# Patient Record
Sex: Female | Born: 1950 | Race: White | Hispanic: No | Marital: Married | Smoking: Former smoker
Health system: Southern US, Community
[De-identification: ages and names within clinical notes are randomized; demographics above are authoritative.]

## PROBLEM LIST (undated history)

## (undated) DIAGNOSIS — R918 Other nonspecific abnormal finding of lung field: Secondary | ICD-10-CM

## (undated) DIAGNOSIS — I10 Essential (primary) hypertension: Secondary | ICD-10-CM

## (undated) DIAGNOSIS — Z862 Personal history of diseases of the blood and blood-forming organs and certain disorders involving the immune mechanism: Secondary | ICD-10-CM

## (undated) DIAGNOSIS — M549 Dorsalgia, unspecified: Secondary | ICD-10-CM

## (undated) DIAGNOSIS — J449 Chronic obstructive pulmonary disease, unspecified: Secondary | ICD-10-CM

## (undated) DIAGNOSIS — R51 Headache: Secondary | ICD-10-CM

## (undated) DIAGNOSIS — M1990 Unspecified osteoarthritis, unspecified site: Secondary | ICD-10-CM

## (undated) DIAGNOSIS — B1910 Unspecified viral hepatitis B without hepatic coma: Secondary | ICD-10-CM

## (undated) DIAGNOSIS — E785 Hyperlipidemia, unspecified: Secondary | ICD-10-CM

## (undated) DIAGNOSIS — G8929 Other chronic pain: Secondary | ICD-10-CM

## (undated) DIAGNOSIS — I5032 Chronic diastolic (congestive) heart failure: Secondary | ICD-10-CM

## (undated) DIAGNOSIS — Z8719 Personal history of other diseases of the digestive system: Secondary | ICD-10-CM

## (undated) DIAGNOSIS — Z9289 Personal history of other medical treatment: Secondary | ICD-10-CM

## (undated) DIAGNOSIS — E039 Hypothyroidism, unspecified: Secondary | ICD-10-CM

## (undated) DIAGNOSIS — I2510 Atherosclerotic heart disease of native coronary artery without angina pectoris: Secondary | ICD-10-CM

## (undated) DIAGNOSIS — K219 Gastro-esophageal reflux disease without esophagitis: Secondary | ICD-10-CM

## (undated) DIAGNOSIS — IMO0002 Reserved for concepts with insufficient information to code with codable children: Secondary | ICD-10-CM

## (undated) HISTORY — DX: Chronic diastolic (congestive) heart failure: I50.32

## (undated) HISTORY — DX: Atherosclerotic heart disease of native coronary artery without angina pectoris: I25.10

## (undated) HISTORY — DX: Other nonspecific abnormal finding of lung field: R91.8

## (undated) HISTORY — DX: Hypothyroidism, unspecified: E03.9

## (undated) HISTORY — PX: CORONARY ANGIOPLASTY WITH STENT PLACEMENT: SHX49

## (undated) HISTORY — PX: JOINT REPLACEMENT: SHX530

## (undated) HISTORY — DX: Reserved for concepts with insufficient information to code with codable children: IMO0002

## (undated) HISTORY — DX: Headache: R51

## (undated) HISTORY — DX: Morbid (severe) obesity due to excess calories: E66.01

---
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
## 1983-03-25 HISTORY — PX: TUBAL LIGATION: SHX77

## 1986-03-24 HISTORY — PX: VAGINAL HYSTERECTOMY: SUR661

## 1997-09-25 ENCOUNTER — Inpatient Hospital Stay (HOSPITAL_COMMUNITY): Admission: EM | Admit: 1997-09-25 | Discharge: 1997-09-26 | Payer: Self-pay | Admitting: Emergency Medicine

## 1998-04-02 ENCOUNTER — Observation Stay (HOSPITAL_COMMUNITY): Admission: EM | Admit: 1998-04-02 | Discharge: 1998-04-03 | Payer: Self-pay | Admitting: Cardiology

## 1998-06-25 ENCOUNTER — Inpatient Hospital Stay (HOSPITAL_COMMUNITY): Admission: EM | Admit: 1998-06-25 | Discharge: 1998-06-26 | Payer: Self-pay | Admitting: Emergency Medicine

## 1998-06-26 ENCOUNTER — Encounter: Payer: Self-pay | Admitting: Cardiology

## 1998-11-25 ENCOUNTER — Inpatient Hospital Stay (HOSPITAL_COMMUNITY): Admission: EM | Admit: 1998-11-25 | Discharge: 1998-11-26 | Payer: Self-pay | Admitting: Cardiology

## 1999-09-29 ENCOUNTER — Inpatient Hospital Stay (HOSPITAL_COMMUNITY): Admission: EM | Admit: 1999-09-29 | Discharge: 1999-09-30 | Payer: Self-pay | Admitting: Internal Medicine

## 2000-03-24 DIAGNOSIS — G8929 Other chronic pain: Secondary | ICD-10-CM

## 2000-03-24 HISTORY — DX: Other chronic pain: G89.29

## 2001-01-15 ENCOUNTER — Ambulatory Visit (HOSPITAL_COMMUNITY): Admission: RE | Admit: 2001-01-15 | Discharge: 2001-01-15 | Payer: Self-pay | Admitting: Cardiology

## 2001-03-19 ENCOUNTER — Ambulatory Visit (HOSPITAL_COMMUNITY): Admission: RE | Admit: 2001-03-19 | Discharge: 2001-03-19 | Payer: Self-pay | Admitting: General Surgery

## 2004-06-03 ENCOUNTER — Emergency Department (HOSPITAL_COMMUNITY): Admission: EM | Admit: 2004-06-03 | Discharge: 2004-06-03 | Payer: Self-pay | Admitting: Emergency Medicine

## 2005-07-31 ENCOUNTER — Encounter (HOSPITAL_COMMUNITY): Admission: RE | Admit: 2005-07-31 | Discharge: 2005-08-30 | Payer: Self-pay | Admitting: General Surgery

## 2005-08-14 ENCOUNTER — Ambulatory Visit: Payer: Self-pay | Admitting: Cardiology

## 2005-09-30 ENCOUNTER — Ambulatory Visit (HOSPITAL_COMMUNITY): Admission: RE | Admit: 2005-09-30 | Discharge: 2005-09-30 | Payer: Self-pay | Admitting: Cardiology

## 2007-10-15 ENCOUNTER — Ambulatory Visit (HOSPITAL_COMMUNITY): Admission: RE | Admit: 2007-10-15 | Discharge: 2007-10-15 | Payer: Self-pay | Admitting: Cardiology

## 2007-10-15 ENCOUNTER — Ambulatory Visit: Payer: Self-pay | Admitting: Cardiology

## 2007-10-17 ENCOUNTER — Emergency Department (HOSPITAL_COMMUNITY): Admission: EM | Admit: 2007-10-17 | Discharge: 2007-10-17 | Payer: Self-pay | Admitting: Emergency Medicine

## 2007-10-19 ENCOUNTER — Ambulatory Visit: Payer: Self-pay | Admitting: Cardiology

## 2007-10-19 ENCOUNTER — Inpatient Hospital Stay (HOSPITAL_COMMUNITY): Admission: AD | Admit: 2007-10-19 | Discharge: 2007-10-22 | Payer: Self-pay | Admitting: Cardiology

## 2007-10-19 ENCOUNTER — Inpatient Hospital Stay (HOSPITAL_BASED_OUTPATIENT_CLINIC_OR_DEPARTMENT_OTHER): Admission: RE | Admit: 2007-10-19 | Discharge: 2007-10-19 | Payer: Self-pay | Admitting: Cardiology

## 2007-10-20 ENCOUNTER — Encounter: Payer: Self-pay | Admitting: Cardiology

## 2007-11-04 ENCOUNTER — Ambulatory Visit: Payer: Self-pay | Admitting: Cardiology

## 2008-02-28 ENCOUNTER — Ambulatory Visit: Payer: Self-pay | Admitting: Cardiology

## 2008-03-24 HISTORY — PX: SHOULDER OPEN ROTATOR CUFF REPAIR: SHX2407

## 2008-04-13 ENCOUNTER — Ambulatory Visit: Payer: Self-pay | Admitting: Cardiology

## 2008-04-14 ENCOUNTER — Ambulatory Visit (HOSPITAL_COMMUNITY): Admission: RE | Admit: 2008-04-14 | Discharge: 2008-04-14 | Payer: Self-pay | Admitting: Cardiology

## 2008-10-10 DIAGNOSIS — R519 Headache, unspecified: Secondary | ICD-10-CM | POA: Insufficient documentation

## 2008-10-10 DIAGNOSIS — R51 Headache: Secondary | ICD-10-CM

## 2008-10-10 DIAGNOSIS — I2510 Atherosclerotic heart disease of native coronary artery without angina pectoris: Secondary | ICD-10-CM | POA: Insufficient documentation

## 2008-10-10 DIAGNOSIS — I10 Essential (primary) hypertension: Secondary | ICD-10-CM | POA: Insufficient documentation

## 2008-10-10 DIAGNOSIS — E039 Hypothyroidism, unspecified: Secondary | ICD-10-CM

## 2008-10-10 DIAGNOSIS — I679 Cerebrovascular disease, unspecified: Secondary | ICD-10-CM

## 2008-10-10 DIAGNOSIS — M549 Dorsalgia, unspecified: Secondary | ICD-10-CM | POA: Insufficient documentation

## 2008-10-10 DIAGNOSIS — D649 Anemia, unspecified: Secondary | ICD-10-CM

## 2008-10-10 DIAGNOSIS — M1990 Unspecified osteoarthritis, unspecified site: Secondary | ICD-10-CM | POA: Insufficient documentation

## 2008-10-22 ENCOUNTER — Inpatient Hospital Stay (HOSPITAL_COMMUNITY): Admission: EM | Admit: 2008-10-22 | Discharge: 2008-11-04 | Payer: Self-pay | Admitting: Cardiology

## 2008-10-22 ENCOUNTER — Ambulatory Visit: Payer: Self-pay | Admitting: Cardiovascular Disease

## 2008-10-23 ENCOUNTER — Encounter (INDEPENDENT_AMBULATORY_CARE_PROVIDER_SITE_OTHER): Payer: Self-pay | Admitting: Internal Medicine

## 2008-10-25 ENCOUNTER — Encounter: Payer: Self-pay | Admitting: Cardiology

## 2008-10-27 ENCOUNTER — Encounter: Payer: Self-pay | Admitting: Cardiology

## 2008-10-27 ENCOUNTER — Ambulatory Visit: Payer: Self-pay | Admitting: Vascular Surgery

## 2008-10-28 ENCOUNTER — Encounter: Payer: Self-pay | Admitting: Cardiology

## 2008-10-28 ENCOUNTER — Encounter: Payer: Self-pay | Admitting: Orthopedic Surgery

## 2008-11-02 ENCOUNTER — Encounter: Payer: Self-pay | Admitting: Cardiology

## 2008-11-02 ENCOUNTER — Ambulatory Visit: Payer: Self-pay | Admitting: Physical Medicine & Rehabilitation

## 2008-11-02 ENCOUNTER — Ambulatory Visit: Payer: Self-pay | Admitting: Vascular Surgery

## 2008-11-03 ENCOUNTER — Encounter: Payer: Self-pay | Admitting: Cardiology

## 2008-11-05 ENCOUNTER — Telehealth (INDEPENDENT_AMBULATORY_CARE_PROVIDER_SITE_OTHER): Payer: Self-pay | Admitting: Nurse Practitioner

## 2008-11-06 ENCOUNTER — Telehealth: Payer: Self-pay | Admitting: Cardiology

## 2008-11-06 DIAGNOSIS — R0602 Shortness of breath: Secondary | ICD-10-CM | POA: Insufficient documentation

## 2008-11-07 ENCOUNTER — Encounter: Payer: Self-pay | Admitting: Cardiology

## 2008-11-07 ENCOUNTER — Ambulatory Visit (HOSPITAL_COMMUNITY): Admission: RE | Admit: 2008-11-07 | Discharge: 2008-11-07 | Payer: Self-pay | Admitting: Cardiology

## 2008-11-07 ENCOUNTER — Telehealth (INDEPENDENT_AMBULATORY_CARE_PROVIDER_SITE_OTHER): Payer: Self-pay | Admitting: *Deleted

## 2008-11-09 LAB — CONVERTED CEMR LAB
Basophils Relative: 1 % (ref 0–1)
HCT: 31.3 % — ABNORMAL LOW (ref 36.0–46.0)
Lymphocytes Relative: 27 % (ref 12–46)
MCHC: 32.6 g/dL (ref 30.0–36.0)
Monocytes Absolute: 0.9 10*3/uL (ref 0.1–1.0)
Monocytes Relative: 15 % — ABNORMAL HIGH (ref 3–12)
Platelets: 302 10*3/uL (ref 150–400)
RBC: 3.44 M/uL — ABNORMAL LOW (ref 3.87–5.11)

## 2008-11-13 ENCOUNTER — Telehealth (INDEPENDENT_AMBULATORY_CARE_PROVIDER_SITE_OTHER): Payer: Self-pay | Admitting: *Deleted

## 2008-11-13 ENCOUNTER — Encounter: Payer: Self-pay | Admitting: Emergency Medicine

## 2008-11-14 ENCOUNTER — Encounter: Payer: Self-pay | Admitting: Cardiology

## 2008-11-14 ENCOUNTER — Inpatient Hospital Stay (HOSPITAL_COMMUNITY): Admission: EM | Admit: 2008-11-14 | Discharge: 2008-11-26 | Payer: Self-pay | Admitting: Cardiology

## 2008-11-14 ENCOUNTER — Ambulatory Visit: Payer: Self-pay | Admitting: Cardiology

## 2008-11-14 ENCOUNTER — Ambulatory Visit: Payer: Self-pay | Admitting: Gastroenterology

## 2008-11-21 ENCOUNTER — Encounter: Payer: Self-pay | Admitting: Cardiology

## 2008-11-23 ENCOUNTER — Encounter: Payer: Self-pay | Admitting: Gastroenterology

## 2008-11-23 ENCOUNTER — Ambulatory Visit: Payer: Self-pay | Admitting: Physical Medicine & Rehabilitation

## 2008-11-23 ENCOUNTER — Encounter: Payer: Self-pay | Admitting: Cardiology

## 2008-11-25 ENCOUNTER — Encounter: Payer: Self-pay | Admitting: Cardiology

## 2008-11-29 ENCOUNTER — Encounter: Payer: Self-pay | Admitting: Gastroenterology

## 2008-11-29 ENCOUNTER — Telehealth: Payer: Self-pay | Admitting: Gastroenterology

## 2008-11-29 ENCOUNTER — Encounter: Payer: Self-pay | Admitting: Cardiology

## 2008-11-29 ENCOUNTER — Telehealth: Payer: Self-pay | Admitting: Cardiology

## 2008-11-30 ENCOUNTER — Telehealth: Payer: Self-pay | Admitting: Cardiology

## 2008-12-01 ENCOUNTER — Telehealth: Payer: Self-pay | Admitting: Cardiology

## 2008-12-11 ENCOUNTER — Telehealth: Payer: Self-pay | Admitting: Gastroenterology

## 2008-12-12 ENCOUNTER — Telehealth: Payer: Self-pay | Admitting: Gastroenterology

## 2008-12-22 ENCOUNTER — Telehealth (INDEPENDENT_AMBULATORY_CARE_PROVIDER_SITE_OTHER): Payer: Self-pay | Admitting: *Deleted

## 2009-01-19 ENCOUNTER — Ambulatory Visit: Payer: Self-pay | Admitting: Cardiology

## 2009-01-19 DIAGNOSIS — K250 Acute gastric ulcer with hemorrhage: Secondary | ICD-10-CM | POA: Insufficient documentation

## 2009-01-22 ENCOUNTER — Telehealth: Payer: Self-pay | Admitting: Cardiology

## 2009-02-07 ENCOUNTER — Encounter: Payer: Self-pay | Admitting: Cardiology

## 2009-03-05 ENCOUNTER — Ambulatory Visit: Payer: Self-pay | Admitting: Orthopedic Surgery

## 2009-03-05 DIAGNOSIS — M1710 Unilateral primary osteoarthritis, unspecified knee: Secondary | ICD-10-CM

## 2009-03-06 ENCOUNTER — Encounter: Payer: Self-pay | Admitting: Orthopedic Surgery

## 2009-03-24 HISTORY — PX: TOTAL KNEE ARTHROPLASTY: SHX125

## 2009-04-10 ENCOUNTER — Encounter (INDEPENDENT_AMBULATORY_CARE_PROVIDER_SITE_OTHER): Payer: Self-pay | Admitting: *Deleted

## 2009-06-06 ENCOUNTER — Telehealth (INDEPENDENT_AMBULATORY_CARE_PROVIDER_SITE_OTHER): Payer: Self-pay | Admitting: *Deleted

## 2009-10-04 ENCOUNTER — Telehealth: Payer: Self-pay | Admitting: Cardiology

## 2009-10-04 ENCOUNTER — Telehealth (INDEPENDENT_AMBULATORY_CARE_PROVIDER_SITE_OTHER): Payer: Self-pay | Admitting: *Deleted

## 2009-10-05 ENCOUNTER — Encounter (INDEPENDENT_AMBULATORY_CARE_PROVIDER_SITE_OTHER): Payer: Self-pay | Admitting: *Deleted

## 2009-10-15 ENCOUNTER — Ambulatory Visit: Payer: Self-pay | Admitting: Cardiology

## 2009-10-15 DIAGNOSIS — I359 Nonrheumatic aortic valve disorder, unspecified: Secondary | ICD-10-CM

## 2009-10-16 ENCOUNTER — Telehealth (INDEPENDENT_AMBULATORY_CARE_PROVIDER_SITE_OTHER): Payer: Self-pay | Admitting: *Deleted

## 2009-10-16 ENCOUNTER — Encounter: Payer: Self-pay | Admitting: Cardiology

## 2009-10-18 ENCOUNTER — Encounter: Payer: Self-pay | Admitting: Cardiology

## 2009-10-18 ENCOUNTER — Ambulatory Visit: Payer: Self-pay | Admitting: Cardiology

## 2009-10-18 ENCOUNTER — Ambulatory Visit (HOSPITAL_COMMUNITY): Admission: RE | Admit: 2009-10-18 | Discharge: 2009-10-18 | Payer: Self-pay | Admitting: Cardiology

## 2009-10-18 ENCOUNTER — Ambulatory Visit: Payer: Self-pay

## 2009-10-30 ENCOUNTER — Telehealth: Payer: Self-pay | Admitting: Cardiology

## 2009-11-29 ENCOUNTER — Encounter: Payer: Self-pay | Admitting: Cardiovascular Disease

## 2009-11-29 ENCOUNTER — Ambulatory Visit: Payer: Self-pay | Admitting: Cardiology

## 2010-03-25 IMAGING — CR DG CHEST 2V
2 series · 2 of 2 positions shown · non-contrast
Comparison: 11/03/2008

CLINICAL DATA: Dyspnea, shortness of breath, CHF, swelling, former
smoker, hypertension

CHEST - 2 VIEW

[view not recorded (1 of 2)]
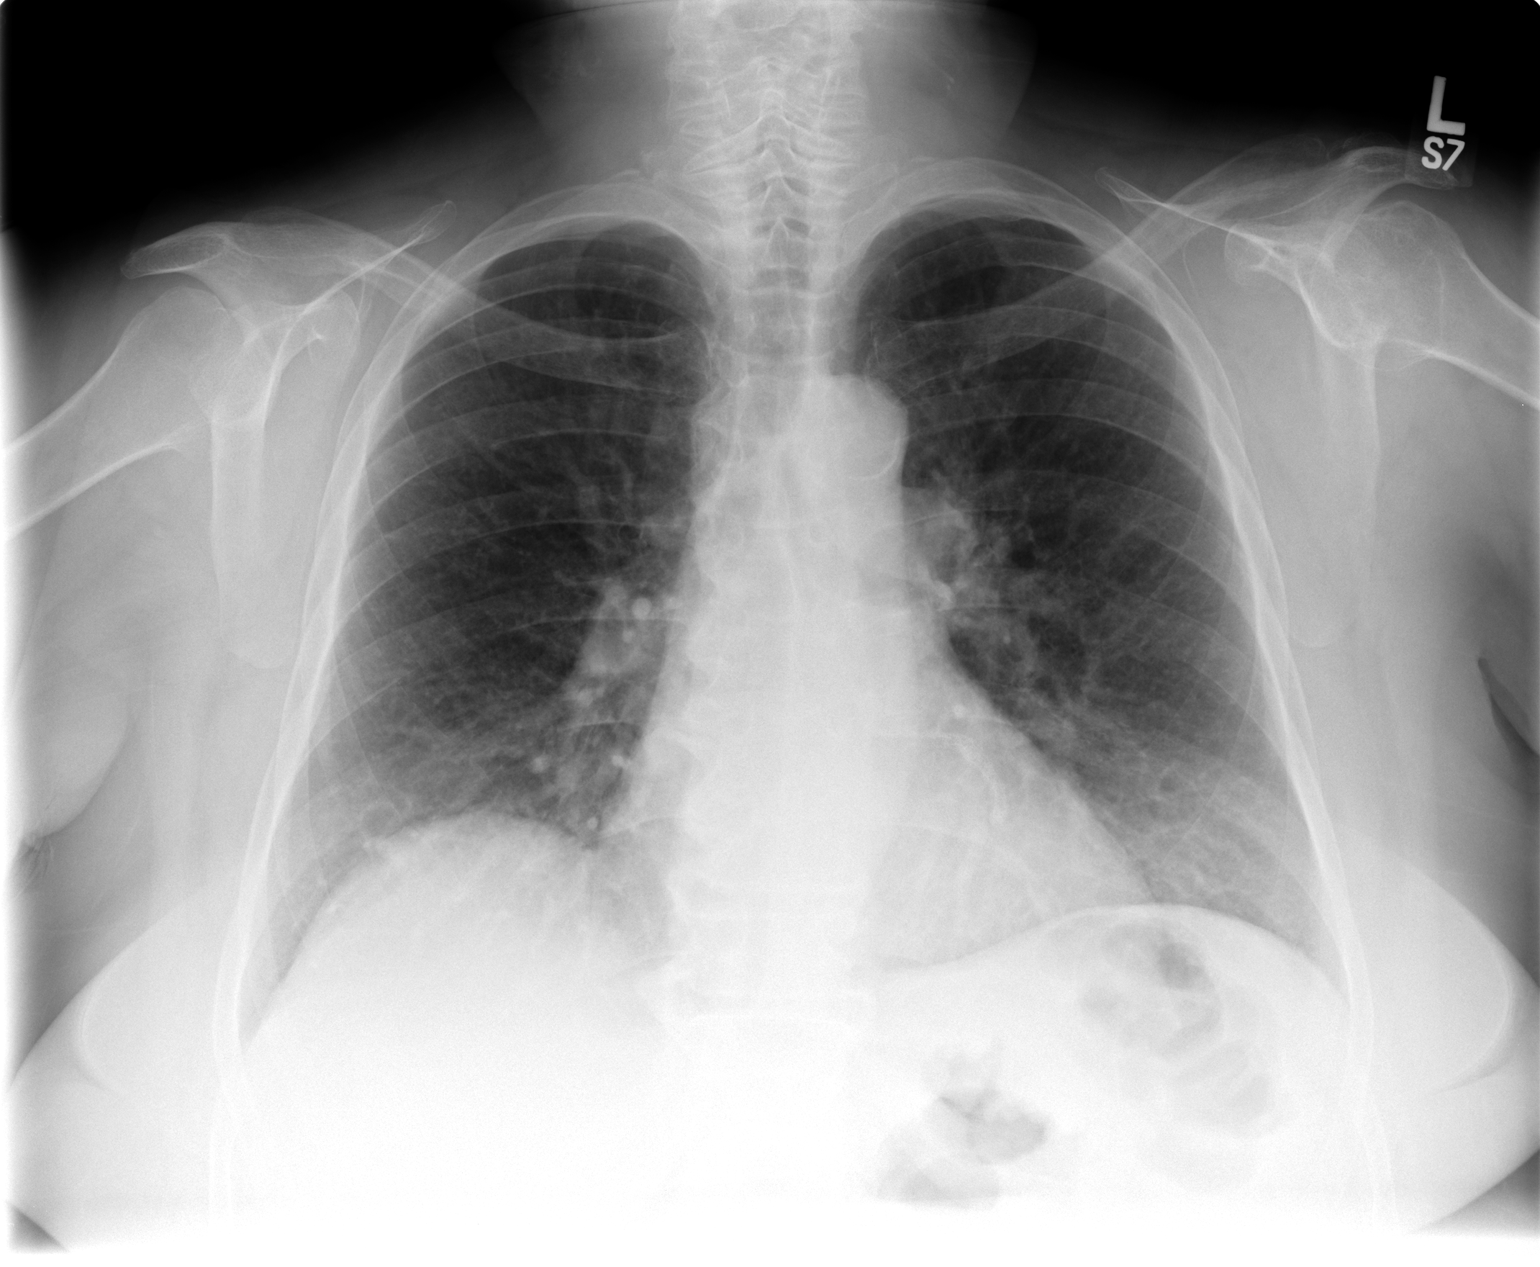

[view not recorded (2 of 2)]
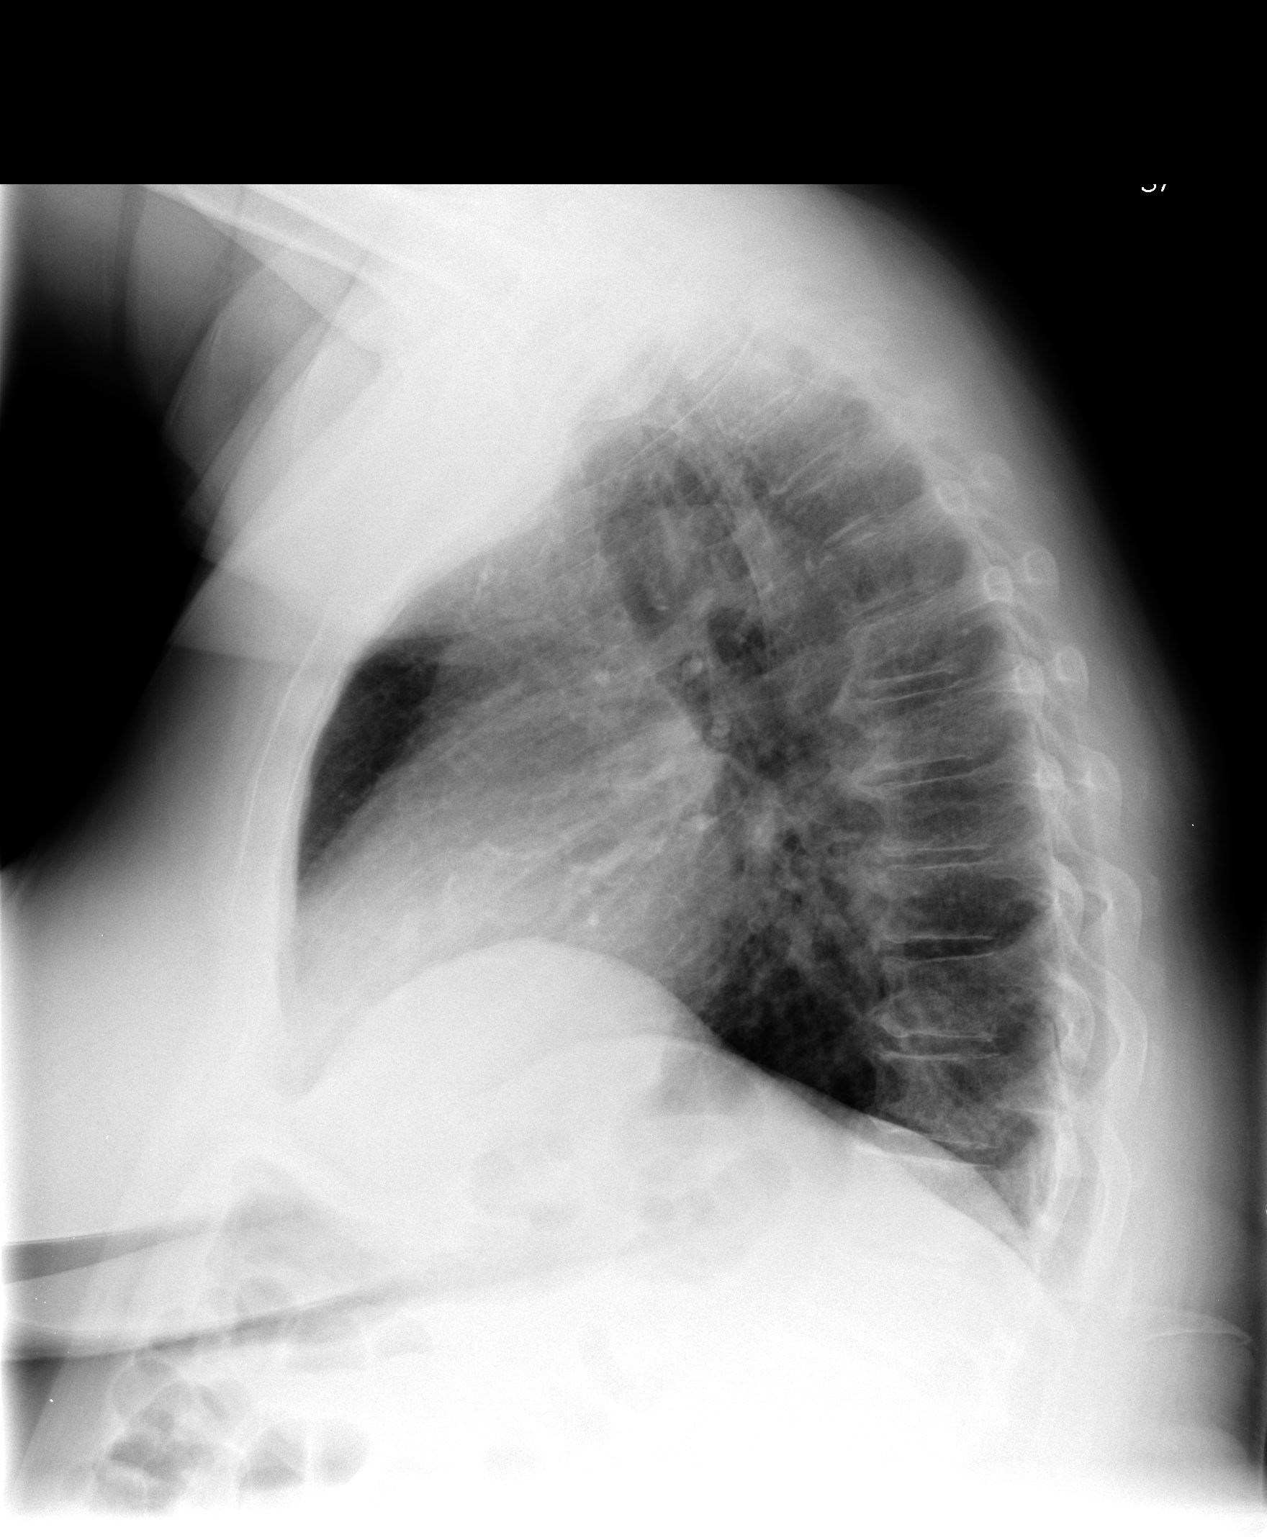

[2 of 2 positions shown; findings below may reference images not displayed]

FINDINGS: Minimal cardiac enlargement.
Calcified tortuous thoracic aorta.
Mediastinal contours and pulmonary vascularity otherwise normal.
Interstitial prominence seen on previous exam improved.
No gross acute failure or consolidation.
Bones appear diffusely demineralized.
End plate spur formation thoracic spine.
IMPRESSION: Improved interstitial edema since previous exam.

## 2010-04-14 ENCOUNTER — Encounter: Payer: Self-pay | Admitting: Cardiology

## 2010-04-25 NOTE — Progress Notes (Signed)
Summary: pt needs sooner appt  Phone Note Call from Patient Call back at Home Phone [phone]   Caller: Patient Reason for Call: Talk to Nurse, Talk to Doctor Summary of Call: pt missed appt she worked late on 3rd shift. Aug 25th is not a good day because she is having total knee surgery on Aug 2nd and would like to be seen prior to that. Initial call taken by: Omer Jack,  October 04, 2009 10:02 AM  Follow-up for Phone Call        This pt can be seen by Dr Riley Kill on 10/15/09 at 3:30.  I will forward this message to Lela to schedule the pt's appt. Julieta Gutting, RN, BSN  October 05, 2009 9:51 AM

## 2010-04-25 NOTE — Progress Notes (Signed)
Summary: REFILL  Phone Note Refill Request Message from:  Patient  PT NEEDS A REFILL FOR Franchot Gallo TO [facility]  [phone]  Initial call taken by: Judie Grieve,  June 06, 2009 2:20 PM Caller: Patient  Follow-up for Phone Call        Rx faxed to pharmacy Follow-up by: Oswald Hillock,  June 06, 2009 4:10 PM    New/Updated Medications: NITROSTAT 0.4 MG SUBL (NITROGLYCERIN) 1 tablet under tongue at onset of chest pain; you may repeat every 5 minutes for up to 3 doses. Prescriptions: NITROSTAT 0.4 MG SUBL (NITROGLYCERIN) 1 tablet under tongue at onset of chest pain; you may repeat every 5 minutes for up to 3 doses.  #25 x 0   Entered by:   Oswald Hillock   Authorized by:   Ronaldo Miyamoto, MD, [facility]   Signed by:   Oswald Hillock on 06/06/2009   Method used:   Electronically to        [facility] (retail)       [address]       [place], [place]  [address]       Ph: [phone]       Fax: [phone]   RxID:   [id number]

## 2010-04-25 NOTE — Miscellaneous (Signed)
Summary: update med  Clinical Lists Changes  Medications: Added new medication of IMDUR 60 MG XR24H-TAB (ISOSORBIDE MONONITRATE) Take 1 tablet by mouth once a day

## 2010-04-25 NOTE — Progress Notes (Signed)
Summary: surgical clearence  Phone Note Call from Patient   Caller: Patient Reason for Call: Talk to Nurse Summary of Call: pt waiting on surgical clearence faxed to dr palemoritis [phone] he hasn't recieved it yet pls call pt when sent [phone] Initial call taken by: Glynda Jaeger,  October 30, 2009 4:49 PM  Follow-up for Phone Call        Records faxed again and pt aware. Follow-up by: Julieta Gutting, RN, BSN,  October 30, 2009 5:01 PM

## 2010-04-25 NOTE — Letter (Signed)
Summary: [facility]  [facility]   Imported By: Marylou Mccoy 12/17/2009 15:20:20  _____________________________________________________________________  External Attachment:    Type:   Image     Comment:   External Document

## 2010-04-25 NOTE — Miscellaneous (Signed)
Summary: update med  Clinical Lists Changes  Medications: Removed medication of SIMVASTATIN 40 MG TABS (SIMVASTATIN) Take 1 tablet by mouth once a day Added new medication of SIMVASTATIN 80 MG TABS (SIMVASTATIN) Take one tablet by mouth daily at bedtime

## 2010-04-25 NOTE — Progress Notes (Signed)
Summary: pt needs refill  Phone Note Refill Request Call back at Home Phone [phone] Message from:  Patient on [facility] in [phone]  Refills Requested: Medication #1:  imdur 60mg  qd Initial call taken by: Omer Jack,  October 04, 2009 9:54 AM  Follow-up for Phone Call        Medication is not listed on med list. Unable to get in touch with pt. by phone. Messge left to call office. Pt. is taken medication refill faxed to pharmacy Follow-up by: Vikki Ports,  October 05, 2009 10:33 AM

## 2010-04-25 NOTE — Progress Notes (Signed)
  Pt left ROI, completed sent to [facility]  October 16, 2009 1:19 PM

## 2010-04-25 NOTE — Miscellaneous (Signed)
Summary: [facility] Interim Order  [facility] Interim Order   Imported By: Roderic Ovens 03/27/2009 15:46:48  _____________________________________________________________________  External Attachment:    Type:   Image     Comment:   External Document

## 2010-04-25 NOTE — Assessment & Plan Note (Signed)
Summary:  surgical clearence    Visit Type:  surg clearance Referring Provider:  Dr [name]  Primary Provider:  [name]  CC:  pt is having left knee surgery tomorrow....edema/legs...no other complaints today.  History of Present Illness: Patient is having no angina at all.  She has stopped smoking and has been asymptomatic.  Has prior DES in LAD and CFX, and then three overlapping DES in the RCA.  All are calcified.   No angina whatsoever.  Unfortunately, she stopped her Plavix and has stopped her ASA because she had a viral illness all last week.  She is now just getting over, and I advised her that was not a good time to have surgery.  Also told her that she should remain on ASA, and we gave her some in the office today.  She absolutely denies any symptoms.    Current Medications (verified): 1)  Metoprolol Tartrate 50 Mg Tabs (Metoprolol Tartrate) .... Take 1/2 Tablet By Mouth Twice A Day 2)  Plavix 75 Mg Tabs (Clopidogrel Bisulfate) .... Take 1 Tablet By Mouth Once A Day 3)  Synthroid 50 Mcg Tabs (Levothyroxine Sodium) .... Take 1 Tablet By Mouth Once A Day 4)  Multivitamins  Tabs (Multiple Vitamin) .... Take 1 Tablet By Mouth Once A Day 5)  Aspirin Ec 325 Mg Tbec (Aspirin) .... Take One Tablet By Mouth Daily 6)  Neurontin 300 Mg Caps (Gabapentin) .... One By Mouth 4 Times A Day 7)  Iron 325 (65 Fe) Mg Tabs (Ferrous Sulfate) .... Take 1 Tablet By Mouth Two Times A Day 8)  Colace 100 Mg Caps (Docusate Sodium) .... Take 1 Tablet By Mouth Two Times A Day 9)  Simvastatin 80 Mg Tabs (Simvastatin) .Marland Kitchen.. 1 Tab At Bedtime 10)  Nitrostat 0.4 Mg Subl (Nitroglycerin) .Marland Kitchen.. 1 Tablet Under Tongue At Onset of Chest Pain; You May Repeat Every 5 Minutes For Up To 3 Doses. 11)  Imdur 60 Mg Xr24h-Tab (Isosorbide Mononitrate) .... Take 1 Tablet By Mouth Once A Day  Allergies (verified): No Known Drug Allergies  Past History:  Past Medical History: Last updated: 03/05/2009 Current Problems:    CEREBROVASCULAR DISEASE (ICD-437.9) UNSPECIFIED ANEMIA (ICD-285.9) OSTEOARTHRITIS, MILD (ICD-715.90) HEADACHE (ICD-784.0) MORBID OBESITY (ICD-278.01) BACK PAIN (ICD-724.5) HYPERTENSION (ICD-401.9) HYPOTHYROIDISM (ICD-244.9) CAD (ICD-414.00) Gastric Ulcer DDD  Past Surgical History: c-section  cath with stent placement mid lad (promus drug elutng stent)2009 2009 cutting ballon angioplasty first obtuse marginal  Three overlapping stents in the RCA.--promus DES x3 bilteral shoulder RCR  Family History: Reviewed history from 03/05/2009 and no changes required. Father:deceased age 29 myocardial infarction Mother:alive age 40 mi cabg times3 FH of Cancer:  Family History of Diabetes Family History Coronary Heart Disease female < 68 Family History of Arthritis  Social History: Reviewed history from 03/05/2009 and no changes required. 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 Head:  normocephalic and atraumatic  Eyes:  PERRLA/EOM intact; conjunctiva and lids normal. Lungs:  Clear bilaterally to auscultation and percussion. Heart:  Normal S1 and S2. SEM  3/6.  No DM.  Pulses:  pulses normal in all 4 extremities Extremities:  No clubbing or cyanosis. Neurologic:  Alert and oriented x 3.   EKG  Procedure date:  10/15/2009  Findings:      SB.  RBBB.  Impression & Recommendations:  Problem # 1:  CAD (ICD-414.00) Continues to remain stable at present time.  No chest pain.  Not smoking.  Tolerating medications well.  Has  stopped ASA and Plavix, which I reiterated to her again today that she cannot do . ASA restarted.  With recent viral illnes, would defer surgery for week.  Will also get echo. Her updated medication list for this problem includes:    Metoprolol Tartrate 50 Mg Tabs (Metoprolol tartrate) .Marland Kitchen... Take 1/2 tablet by mouth twice a day    Plavix 75 Mg Tabs (Clopidogrel bisulfate) .Marland Kitchen... Take 1 tablet by mouth once a day    Aspirin Ec 325 Mg Tbec (Aspirin) .Marland Kitchen... Take one tablet by mouth daily    Nitrostat 0.4 Mg Subl (Nitroglycerin) .Marland Kitchen... 1 tablet under tongue at onset of chest pain; you may repeat every 5 minutes for up to 3 doses.    Imdur 60 Mg Xr24h-tab (Isosorbide mononitrate) .Marland Kitchen... Take 1 tablet by mouth once a day  Orders: EKG w/ Interpretation (93000) Echocardiogram (Echo)  Problem # 2:  AORTIC STENOSIS/ INSUFFICIENCY, NON-RHEUMATIC (ICD-424.1) Prom murmur.  Need to recheck again so will get an echo.  Would lean against spinal for surgery if they have concerns over BP control.  Her updated medication list for this problem includes:    Metoprolol Tartrate 50 Mg Tabs (Metoprolol tartrate) .Marland Kitchen... Take 1/2 tablet by mouth twice a day    Nitrostat 0.4 Mg Subl (Nitroglycerin) .Marland Kitchen... 1 tablet under tongue at onset of chest pain; you may repeat every 5 minutes for up to 3 doses.    Imdur 60 Mg Xr24h-tab (Isosorbide mononitrate) .Marland Kitchen... Take 1 tablet by mouth once a day  Problem # 3:  HYPERTENSION (ICD-401.9) controlled. Her updated medication list for this problem includes:    Metoprolol Tartrate 50 Mg Tabs (Metoprolol tartrate) .Marland Kitchen... Take 1/2 tablet by mouth twice a day    Aspirin Ec 325 Mg Tbec (Aspirin) .Marland Kitchen... Take one tablet by mouth daily  Orders: EKG w/ Interpretation (93000) Echocardiogram (Echo)  Patient Instructions: 1)  Your physician has requested that you have an echocardiogram.  Echocardiography is a painless test that uses sound waves to create images of your heart. It provides your  doctor with information about the size and shape of your heart and how well your heart's chambers and valves are working.  This procedure takes approximately one hour. There are no restrictions for this procedure. 2)  Your physician has recommended you make the following change in your medication: RESTART Aspirin 325mg  once a day 3)  Your physician wants you to follow-up in:   6 MONTHS. You will receive a reminder letter in the mail two months in advance. If you don't receive a letter, please call our office to schedule the follow-up appointment.

## 2010-06-28 LAB — BASIC METABOLIC PANEL
BUN: 14 mg/dL (ref 6–23)
BUN: 18 mg/dL (ref 6–23)
BUN: 22 mg/dL (ref 6–23)
CO2: 27 mEq/L (ref 19–32)
Calcium: 8.6 mg/dL (ref 8.4–10.5)
Calcium: 9.2 mg/dL (ref 8.4–10.5)
Calcium: 9.6 mg/dL (ref 8.4–10.5)
Chloride: 96 mEq/L (ref 96–112)
GFR calc Af Amer: >60 mL/min (ref >60)
GFR calc non Af Amer: 55 mL/min — ABNORMAL LOW (ref >60)
GFR calc non Af Amer: 58 mL/min — ABNORMAL LOW (ref >60)
GFR calc non Af Amer: >60 mL/min (ref >60)
Glucose, Bld: 116 mg/dL — ABNORMAL HIGH (ref 70–99)
Glucose, Bld: 79 mg/dL (ref 70–99)
Glucose, Bld: 82 mg/dL (ref 70–99)
Potassium: 4.3 mEq/L (ref 3.5–5.1)
Potassium: 4.7 mEq/L (ref 3.5–5.1)
Sodium: 130 mEq/L — ABNORMAL LOW (ref 135–145)
Sodium: 132 mEq/L — ABNORMAL LOW (ref 135–145)
Sodium: 132 mEq/L — ABNORMAL LOW (ref 135–145)
Sodium: 133 mEq/L — ABNORMAL LOW (ref 135–145)

## 2010-06-28 LAB — CBC
HCT: 26.5 % — ABNORMAL LOW (ref 36.0–46.0)
HCT: 30.5 % — ABNORMAL LOW (ref 36.0–46.0)
HCT: 30.8 % — ABNORMAL LOW (ref 36.0–46.0)
HCT: 31 % — ABNORMAL LOW (ref 36.0–46.0)
Hemoglobin: 10.3 g/dL — ABNORMAL LOW (ref 12.0–15.0)
Hemoglobin: 10.3 g/dL — ABNORMAL LOW (ref 12.0–15.0)
Hemoglobin: 10.4 g/dL — ABNORMAL LOW (ref 12.0–15.0)
Hemoglobin: 10.4 g/dL — ABNORMAL LOW (ref 12.0–15.0)
Hemoglobin: 8.9 g/dL — ABNORMAL LOW (ref 12.0–15.0)
MCHC: 33.5 g/dL (ref 30.0–36.0)
MCHC: 33.8 g/dL (ref 30.0–36.0)
MCHC: 34 g/dL (ref 30.0–36.0)
MCV: 91.1 fL (ref 78.0–100.0)
MCV: 91.7 fL (ref 78.0–100.0)
MCV: 91.8 fL (ref 78.0–100.0)
MCV: 92.1 fL (ref 78.0–100.0)
Platelets: 190 K/uL (ref 150–400)
Platelets: 207 K/uL (ref 150–400)
Platelets: 208 10*3/uL (ref 150–400)
RBC: 2.87 MIL/uL — ABNORMAL LOW (ref 3.87–5.11)
RBC: 3.34 MIL/uL — ABNORMAL LOW (ref 3.87–5.11)
RBC: 3.35 MIL/uL — ABNORMAL LOW (ref 3.87–5.11)
RBC: 3.35 MIL/uL — ABNORMAL LOW (ref 3.87–5.11)
RBC: 3.38 MIL/uL — ABNORMAL LOW (ref 3.87–5.11)
RDW: 14.2 % (ref 11.5–15.5)
RDW: 14.4 % (ref 11.5–15.5)
RDW: 14.7 % (ref 11.5–15.5)
WBC: 10 10*3/uL (ref 4.0–10.5)
WBC: 6.2 10*3/uL (ref 4.0–10.5)
WBC: 6.4 K/uL (ref 4.0–10.5)
WBC: 7.4 K/uL (ref 4.0–10.5)
WBC: 7.8 10*3/uL (ref 4.0–10.5)

## 2010-06-28 LAB — FOLATE RBC: RBC Folate: 1009 ng/mL — ABNORMAL HIGH (ref 180–600)

## 2010-06-28 LAB — VITAMIN B12 BINDING CAPACITY, BLOOD: Vitamin B12 Bind Capacity: 815 pg/mL (ref 650–1340)

## 2010-06-28 LAB — FERRITIN: Ferritin: 147 ng/mL (ref 10–291)

## 2010-06-28 LAB — IRON AND TIBC: UIBC: 234 ug/dL

## 2010-06-29 LAB — URINALYSIS, ROUTINE W REFLEX MICROSCOPIC
Bilirubin Urine: NEGATIVE
Bilirubin Urine: NEGATIVE
Glucose, UA: NEGATIVE mg/dL
Ketones, ur: NEGATIVE mg/dL
Ketones, ur: NEGATIVE mg/dL
Nitrite: NEGATIVE
Nitrite: NEGATIVE
Protein, ur: NEGATIVE mg/dL
Protein, ur: NEGATIVE mg/dL
Specific Gravity, Urine: 1.005 (ref 1.005–1.030)
Specific Gravity, Urine: 1.008 (ref 1.005–1.030)
Urobilinogen, UA: 0.2 mg/dL (ref 0.0–1.0)
pH: 7 (ref 5.0–8.0)
pH: 7 (ref 5.0–8.0)

## 2010-06-29 LAB — DIFFERENTIAL
Basophils Absolute: 0.1 10*3/uL (ref 0.0–0.1)
Basophils Relative: 1 % (ref 0–1)
Eosinophils Relative: 2 % (ref 0–5)
Lymphocytes Relative: 3 % — ABNORMAL LOW (ref 12–46)
Lymphs Abs: 0.4 10*3/uL — ABNORMAL LOW (ref 0.7–4.0)
Monocytes Absolute: 1 10*3/uL (ref 0.1–1.0)
Monocytes Relative: 4 % (ref 3–12)
Neutro Abs: 16.3 10*3/uL — ABNORMAL HIGH (ref 1.7–7.7)
Neutrophils Relative %: 93 % — ABNORMAL HIGH (ref 43–77)

## 2010-06-29 LAB — CROSSMATCH
ABO/RH(D): A POS
Antibody Screen: NEGATIVE

## 2010-06-29 LAB — BASIC METABOLIC PANEL
BUN: 13 mg/dL (ref 6–23)
BUN: 16 mg/dL (ref 6–23)
BUN: 18 mg/dL (ref 6–23)
BUN: 21 mg/dL (ref 6–23)
BUN: 23 mg/dL (ref 6–23)
BUN: 8 mg/dL (ref 6–23)
BUN: 9 mg/dL (ref 6–23)
CO2: 32 mEq/L (ref 19–32)
CO2: 27 mEq/L (ref 19–32)
CO2: 25 mEq/L (ref 19–32)
CO2: 28 mEq/L (ref 19–32)
CO2: 28 mEq/L (ref 19–32)
CO2: 28 mEq/L (ref 19–32)
CO2: 29 mEq/L (ref 19–32)
Calcium: 8.3 mg/dL — ABNORMAL LOW (ref 8.4–10.5)
Calcium: 8.8 mg/dL (ref 8.4–10.5)
Calcium: 8.8 mg/dL (ref 8.4–10.5)
Calcium: 9.3 mg/dL (ref 8.4–10.5)
Calcium: 8.4 mg/dL (ref 8.4–10.5)
Calcium: 8.6 mg/dL (ref 8.4–10.5)
Chloride: 88 mEq/L — ABNORMAL LOW (ref 96–112)
Chloride: 88 mEq/L — ABNORMAL LOW (ref 96–112)
Chloride: 97 mEq/L (ref 96–112)
Chloride: 97 mEq/L (ref 96–112)
Chloride: 98 mEq/L (ref 96–112)
Chloride: 99 mEq/L (ref 96–112)
Creatinine, Ser: 0.77 mg/dL (ref 0.4–1.2)
Creatinine, Ser: 0.85 mg/dL (ref 0.4–1.2)
Creatinine, Ser: 1.5 mg/dL — ABNORMAL HIGH (ref 0.4–1.2)
Creatinine, Ser: 0.61 mg/dL (ref 0.4–1.2)
Creatinine, Ser: 0.87 mg/dL (ref 0.4–1.2)
GFR calc Af Amer: 43 mL/min — ABNORMAL LOW (ref >60)
GFR calc Af Amer: >60 mL/min (ref >60)
GFR calc Af Amer: >60 mL/min (ref >60)
GFR calc Af Amer: >60 mL/min (ref >60)
GFR calc Af Amer: >60 mL/min (ref >60)
GFR calc non Af Amer: 37 mL/min — ABNORMAL LOW (ref >60)
GFR calc non Af Amer: 52 mL/min — ABNORMAL LOW (ref >60)
GFR calc non Af Amer: >60 mL/min (ref >60)
GFR calc non Af Amer: >60 mL/min (ref >60)
GFR calc non Af Amer: >60 mL/min (ref >60)
GFR calc non Af Amer: >60 mL/min (ref >60)
GFR calc non Af Amer: >60 mL/min (ref >60)
Glucose, Bld: 110 mg/dL — ABNORMAL HIGH (ref 70–99)
Glucose, Bld: 78 mg/dL (ref 70–99)
Glucose, Bld: 84 mg/dL (ref 70–99)
Glucose, Bld: 95 mg/dL (ref 70–99)
Glucose, Bld: 89 mg/dL (ref 70–99)
Glucose, Bld: 118 mg/dL — ABNORMAL HIGH (ref 70–99)
Glucose, Bld: 68 mg/dL — ABNORMAL LOW (ref 70–99)
Glucose, Bld: 89 mg/dL (ref 70–99)
Glucose, Bld: 92 mg/dL (ref 70–99)
Potassium: 4.5 mEq/L (ref 3.5–5.1)
Potassium: 4.5 mEq/L (ref 3.5–5.1)
Potassium: 4.8 mEq/L (ref 3.5–5.1)
Potassium: 4 mEq/L (ref 3.5–5.1)
Potassium: 4.3 mEq/L (ref 3.5–5.1)
Potassium: 4.5 mEq/L (ref 3.5–5.1)
Potassium: 4.7 mEq/L (ref 3.5–5.1)
Potassium: 4.9 mEq/L (ref 3.5–5.1)
Sodium: 130 mEq/L — ABNORMAL LOW (ref 135–145)
Sodium: 135 mEq/L (ref 135–145)
Sodium: 123 mEq/L — ABNORMAL LOW (ref 135–145)
Sodium: 126 mEq/L — ABNORMAL LOW (ref 135–145)
Sodium: 130 mEq/L — ABNORMAL LOW (ref 135–145)
Sodium: 132 mEq/L — ABNORMAL LOW (ref 135–145)
Sodium: 132 mEq/L — ABNORMAL LOW (ref 135–145)
Sodium: 135 mEq/L (ref 135–145)

## 2010-06-29 LAB — CARDIAC PANEL(CRET KIN+CKTOT+MB+TROPI)
CK, MB: 1.4 ng/mL (ref 0.3–4.0)
CK, MB: 1.4 ng/mL (ref 0.3–4.0)
CK, MB: 1.2 ng/mL (ref 0.3–4.0)
CK, MB: 2 ng/mL (ref 0.3–4.0)
CK, MB: 2.1 ng/mL (ref 0.3–4.0)
Relative Index: INVALID (ref 0.0–2.5)
Relative Index: 0.6 (ref 0.0–2.5)
Relative Index: 0.7 (ref 0.0–2.5)
Relative Index: 0.9 (ref 0.0–2.5)
Relative Index: INVALID (ref 0.0–2.5)
Total CK: 23 U/L (ref 7–177)
Total CK: 33 U/L (ref 7–177)
Total CK: 232 U/L — ABNORMAL HIGH (ref 7–177)
Total CK: 30 U/L (ref 7–177)
Troponin I: 0.03 ng/mL (ref 0.00–0.06)
Troponin I: 0.03 ng/mL (ref 0.00–0.06)
Troponin I: 0.03 ng/mL (ref 0.00–0.06)
Troponin I: 0.25 ng/mL — ABNORMAL HIGH (ref 0.00–0.06)

## 2010-06-29 LAB — CBC
HCT: 25.3 % — ABNORMAL LOW (ref 36.0–46.0)
HCT: 25.4 % — ABNORMAL LOW (ref 36.0–46.0)
HCT: 27.4 % — ABNORMAL LOW (ref 36.0–46.0)
HCT: 27.1 % — ABNORMAL LOW (ref 36.0–46.0)
HCT: 28.5 % — ABNORMAL LOW (ref 36.0–46.0)
HCT: 29.3 % — ABNORMAL LOW (ref 36.0–46.0)
HCT: 29.9 % — ABNORMAL LOW (ref 36.0–46.0)
HCT: 34.5 % — ABNORMAL LOW (ref 36.0–46.0)
HCT: 35.2 % — ABNORMAL LOW (ref 36.0–46.0)
Hemoglobin: 8.5 g/dL — ABNORMAL LOW (ref 12.0–15.0)
Hemoglobin: 8.5 g/dL — ABNORMAL LOW (ref 12.0–15.0)
Hemoglobin: 9.5 g/dL — ABNORMAL LOW (ref 12.0–15.0)
Hemoglobin: 9.5 g/dL — ABNORMAL LOW (ref 12.0–15.0)
Hemoglobin: 10.1 g/dL — ABNORMAL LOW (ref 12.0–15.0)
Hemoglobin: 10.1 g/dL — ABNORMAL LOW (ref 12.0–15.0)
Hemoglobin: 11.2 g/dL — ABNORMAL LOW (ref 12.0–15.0)
Hemoglobin: 11.7 g/dL — ABNORMAL LOW (ref 12.0–15.0)
Hemoglobin: 12 g/dL (ref 12.0–15.0)
Hemoglobin: 9.8 g/dL — ABNORMAL LOW (ref 12.0–15.0)
MCHC: 33.7 g/dL (ref 30.0–36.0)
MCHC: 34 g/dL (ref 30.0–36.0)
MCHC: 35.2 g/dL (ref 30.0–36.0)
MCHC: 34 g/dL (ref 30.0–36.0)
MCHC: 34 g/dL (ref 30.0–36.0)
MCHC: 34 g/dL (ref 30.0–36.0)
MCHC: 34.1 g/dL (ref 30.0–36.0)
MCHC: 34.2 g/dL (ref 30.0–36.0)
MCHC: 34.3 g/dL (ref 30.0–36.0)
MCHC: 34.3 g/dL (ref 30.0–36.0)
MCV: 90.2 fL (ref 78.0–100.0)
MCV: 91.6 fL (ref 78.0–100.0)
MCV: 92.3 fL (ref 78.0–100.0)
MCV: 92.5 fL (ref 78.0–100.0)
MCV: 92.7 fL (ref 78.0–100.0)
MCV: 92.7 fL (ref 78.0–100.0)
MCV: 92.9 fL (ref 78.0–100.0)
MCV: 93 fL (ref 78.0–100.0)
MCV: 93 fL (ref 78.0–100.0)
MCV: 93.2 fL (ref 78.0–100.0)
Platelets: 238 10*3/uL (ref 150–400)
Platelets: 248 10*3/uL (ref 150–400)
Platelets: 253 10*3/uL (ref 150–400)
Platelets: 209 10*3/uL (ref 150–400)
Platelets: 211 10*3/uL (ref 150–400)
Platelets: 224 10*3/uL (ref 150–400)
Platelets: 250 10*3/uL (ref 150–400)
Platelets: 255 10*3/uL (ref 150–400)
Platelets: 263 10*3/uL (ref 150–400)
Platelets: 263 10*3/uL (ref 150–400)
Platelets: 275 10*3/uL (ref 150–400)
Platelets: 282 10*3/uL (ref 150–400)
RBC: 2.75 MIL/uL — ABNORMAL LOW (ref 3.87–5.11)
RBC: 3.07 MIL/uL — ABNORMAL LOW (ref 3.87–5.11)
RBC: 3.07 MIL/uL — ABNORMAL LOW (ref 3.87–5.11)
RBC: 3.17 MIL/uL — ABNORMAL LOW (ref 3.87–5.11)
RBC: 3.18 MIL/uL — ABNORMAL LOW (ref 3.87–5.11)
RBC: 3.36 MIL/uL — ABNORMAL LOW (ref 3.87–5.11)
RBC: 3.57 MIL/uL — ABNORMAL LOW (ref 3.87–5.11)
RBC: 3.63 MIL/uL — ABNORMAL LOW (ref 3.87–5.11)
RBC: 3.76 MIL/uL — ABNORMAL LOW (ref 3.87–5.11)
RBC: 3.78 MIL/uL — ABNORMAL LOW (ref 3.87–5.11)
RDW: 14.2 % (ref 11.5–15.5)
RDW: 14.5 % (ref 11.5–15.5)
RDW: 14.7 % (ref 11.5–15.5)
RDW: 14.7 % (ref 11.5–15.5)
RDW: 14.1 % (ref 11.5–15.5)
RDW: 13.9 % (ref 11.5–15.5)
RDW: 14.1 % (ref 11.5–15.5)
RDW: 14.3 % (ref 11.5–15.5)
RDW: 14.7 % (ref 11.5–15.5)
WBC: 6.3 10*3/uL (ref 4.0–10.5)
WBC: 7.1 10*3/uL (ref 4.0–10.5)
WBC: 17.7 10*3/uL — ABNORMAL HIGH (ref 4.0–10.5)
WBC: 5.4 10*3/uL (ref 4.0–10.5)
WBC: 5.6 10*3/uL (ref 4.0–10.5)
WBC: 5.9 10*3/uL (ref 4.0–10.5)
WBC: 6.8 10*3/uL (ref 4.0–10.5)
WBC: 6.8 10*3/uL (ref 4.0–10.5)
WBC: 7.8 10*3/uL (ref 4.0–10.5)

## 2010-06-29 LAB — COMPREHENSIVE METABOLIC PANEL
ALT: 12 U/L (ref 0–35)
ALT: 17 U/L (ref 0–35)
AST: 17 U/L (ref 0–37)
AST: 23 U/L (ref 0–37)
AST: 30 U/L (ref 0–37)
AST: 31 U/L (ref 0–37)
Albumin: 3.4 g/dL — ABNORMAL LOW (ref 3.5–5.2)
Albumin: 4.1 g/dL (ref 3.5–5.2)
Albumin: 4.2 g/dL (ref 3.5–5.2)
Alkaline Phosphatase: 53 U/L (ref 39–117)
Alkaline Phosphatase: 41 U/L (ref 39–117)
Alkaline Phosphatase: 42 U/L (ref 39–117)
Alkaline Phosphatase: 43 U/L (ref 39–117)
Alkaline Phosphatase: 48 U/L (ref 39–117)
BUN: 15 mg/dL (ref 6–23)
CO2: 33 mEq/L — ABNORMAL HIGH (ref 19–32)
CO2: 28 mEq/L (ref 19–32)
Calcium: 9.2 mg/dL (ref 8.4–10.5)
Chloride: 97 mEq/L (ref 96–112)
Chloride: 94 mEq/L — ABNORMAL LOW (ref 96–112)
Chloride: 98 mEq/L (ref 96–112)
Creatinine, Ser: 0.64 mg/dL (ref 0.4–1.2)
Creatinine, Ser: 0.87 mg/dL (ref 0.4–1.2)
Creatinine, Ser: 0.87 mg/dL (ref 0.4–1.2)
GFR calc Af Amer: >60 mL/min (ref >60)
GFR calc Af Amer: >60 mL/min (ref >60)
GFR calc Af Amer: >60 mL/min (ref >60)
GFR calc Af Amer: >60 mL/min (ref >60)
GFR calc non Af Amer: >60 mL/min (ref >60)
GFR calc non Af Amer: >60 mL/min (ref >60)
GFR calc non Af Amer: >60 mL/min (ref >60)
Potassium: 4.3 mEq/L (ref 3.5–5.1)
Potassium: 4.6 mEq/L (ref 3.5–5.1)
Potassium: 5 mEq/L (ref 3.5–5.1)
Sodium: 129 mEq/L — ABNORMAL LOW (ref 135–145)
Sodium: 132 mEq/L — ABNORMAL LOW (ref 135–145)
Total Bilirubin: 0.4 mg/dL (ref 0.3–1.2)
Total Bilirubin: 0.7 mg/dL (ref 0.3–1.2)
Total Bilirubin: 0.8 mg/dL (ref 0.3–1.2)
Total Bilirubin: 1.1 mg/dL (ref 0.3–1.2)
Total Protein: 6.6 g/dL (ref 6.0–8.3)
Total Protein: 6.8 g/dL (ref 6.0–8.3)

## 2010-06-29 LAB — URINE CULTURE: Culture: NO GROWTH

## 2010-06-29 LAB — TYPE AND SCREEN: ABO/RH(D): A POS

## 2010-06-29 LAB — CULTURE, BLOOD (ROUTINE X 2): Culture: NO GROWTH

## 2010-06-29 LAB — CK TOTAL AND CKMB (NOT AT ARMC)
Relative Index: INVALID (ref 0.0–2.5)
Total CK: 28 U/L (ref 7–177)
Total CK: 43 U/L (ref 7–177)

## 2010-06-29 LAB — URINALYSIS, MICROSCOPIC ONLY
Bilirubin Urine: NEGATIVE
Glucose, UA: NEGATIVE mg/dL
Glucose, UA: NEGATIVE mg/dL
Hgb urine dipstick: NEGATIVE
Ketones, ur: NEGATIVE mg/dL
Leukocytes, UA: NEGATIVE
Specific Gravity, Urine: 1.006 (ref 1.005–1.030)
pH: 8 (ref 5.0–8.0)
pH: 7 (ref 5.0–8.0)

## 2010-06-29 LAB — LIPID PANEL
Cholesterol: 135 mg/dL (ref 0–200)
Total CHOL/HDL Ratio: 1.9 RATIO
VLDL: 13 mg/dL (ref 0–40)

## 2010-06-29 LAB — URINE MICROSCOPIC-ADD ON

## 2010-06-29 LAB — HEMOCCULT GUIAC POC 1CARD (OFFICE): Fecal Occult Bld: POSITIVE

## 2010-06-29 LAB — APTT: aPTT: 28 seconds (ref 24–37)

## 2010-06-29 LAB — BRAIN NATRIURETIC PEPTIDE: Pro B Natriuretic peptide (BNP): 203 pg/mL — ABNORMAL HIGH (ref 0.0–100.0)

## 2010-06-29 LAB — D-DIMER, QUANTITATIVE: D-Dimer, Quant: 0.56 ug/mL-FEU — ABNORMAL HIGH (ref 0.00–0.48)

## 2010-06-29 LAB — TSH: TSH: 1.957 u[IU]/mL (ref 0.350–4.500)

## 2010-06-29 LAB — HEPARIN LEVEL (UNFRACTIONATED): Heparin Unfractionated: 0.27 IU/mL — ABNORMAL LOW (ref 0.30–0.70)

## 2010-07-26 ENCOUNTER — Other Ambulatory Visit: Payer: Self-pay | Admitting: Cardiology

## 2010-07-29 NOTE — Telephone Encounter (Signed)
[place] pt. 

## 2010-07-30 ENCOUNTER — Other Ambulatory Visit: Payer: Self-pay | Admitting: Cardiology

## 2010-07-30 NOTE — Telephone Encounter (Signed)
[place] pt

## 2010-07-30 NOTE — Telephone Encounter (Signed)
Pt no longer see Dr. Dietrich Pates, is seen by Dr. Riley Kill

## 2010-08-06 NOTE — Cardiovascular Report (Signed)
NAMEDANETRA, GLOCK               ACCOUNT NO.:  [id number]   MEDICAL RECORD NO.:  [id number]          PATIENT TYPE:  INP   LOCATION:  2925                         FACILITY:  [facility]   PHYSICIAN:  Verne Carrow, MDDATE OF BIRTH:  05-Apr-1950   DATE OF PROCEDURE:  DATE OF DISCHARGE:  10/21/2007                            CARDIAC CATHETERIZATION   PROCEDURE:  1. Percutaneous coronary intervention with stent placement in the mid      circumflex coronary artery.  2. Angioplasty with AngioSculpt cutting balloon of first obtuse      marginal coronary artery.   OPERATORS:  Everardo Beals Juanda Chance, MD, [facility]  Verne Carrow, MD   INDICATION:  Chest pain.   This is a stage procedure following percutaneous coronary intervention  with stent placement in the mid LAD, 2 days ago.   DETAILS OF PROCEDURE:  The patient was brought to the catheterization  laboratory after signing informed consent for the procedure.  Her left  groin was prepped and draped in the sterile fashion.  The left femoral  artery was engaged via the Seldinger technique and a 7-French sheath was  placed in the left femoral artery.  An XB guide was used to engage the  left main coronary artery.  At this point, we placed a Cougar  intracoronary wire down the obtuse marginal branch.  We then placed a  BMW wire down the true circumflex.  We then passed a 2.25 x 12 mm apex  coronary balloon down the mid circumflex and inflated here.  The  prestenosis on this lesion was 90%.  We then removed the apex balloon  and inserted at 2.5 x 10 mm AngioSculpt cutting balloon across the  obtuse marginal branch.  After inflating 3 times here, we removed this  balloon.  At this point, a 2.5 x 18 mm Promus drug-eluting stent was  placed across the OM-1 down into the mid circumflex.  Nitroglycerin was  given and showed that the plaque had shifted slightly into the obtuse  marginal.  We then used a 2.75 x 15 mm Quantum Maverick noncompliant  balloon for post dilatation of the stent.  Following all the balloon  inflations, the lesion in the mid circumflex was 0%.  The lesion in the  ostium of the obtuse marginal was 70% due to plaque shift.  Angiomax was  used for anticoagulation during the procedure.  Of note, the patient had  been loaded with Plavix 2 days ago prior to the intervention of her left  anterior descending.  The patient tolerated the procedure well.  Hemostasis was achieved with an Angio-Seal closure device.  The patient  was taken to recovery area in stable condition.   IMPRESSION:  1. Status post percutaneous coronary intervention with placement of a      Promus drug-eluting stent in the mid      circumflex coronary artery.  2. Angioplasty of the first obtuse marginal coronary artery.   RECOMMENDATIONS:  Continue current medical management.      Verne Carrow, MD  Electronically Signed     CM/MEDQ  D:  10/21/2007  T:  10/22/2007  Job:  [id number]

## 2010-08-06 NOTE — Cardiovascular Report (Signed)
[name], [name]                ACCOUNT NO.:  [id number]   MEDICAL RECORD NO.:  [id number]          PATIENT TYPE:  INP   LOCATION:  2911                         FACILITY:  [facility]   PHYSICIAN:  Bevelyn Buckles. 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  She also has  coronary artery disease.  She underwent drug-eluting stenting of the LAD  and circumflex in July 2009.  She has recently been having increased  chest pain.  She is referred for cardiac catheterization.   PROCEDURES PERFORMED:  1. Selective coronary angiography.  2. Left heart catheterization.  3. Left ventriculogram.  4. Abdominal aortogram done to evaluate exertional leg pain.   DESCRIPTION OF PROCEDURE:  The risks and indications were explained.  Consent was signed and placed on the chart.  A 5-French arterial sheath  was placed in the right femoral artery using a modified Seldinger  technique.  Standard catheters including JL-4, JR-4, and angled pigtail  were used for catheterization.  All catheter exchanges were made over a  wire.  No apparent complications.   Central aortic pressure is 104/61 with a mean of 79.  Aortic pressure  104/61 with a mean of 79.  LV pressure 119/17 with an EDP of 30.  There  is no aortic stenosis.   Left main was normal.   LAD was a calcified vessel.  There was a what appeared to be overlapping  stent in the midsection.  This stent was widely patent.  There was a  minor luminal irregularity after the stent, otherwise normal.   Left circumflex was a heavily calcified vessel that gave off a small  ramus branch, a large OM-1 and a small OM-2 and proximal left circumflex  has a 30% lesion.  In the mid AV groove circ, there was widely patent  stent.  Just after the  stent, there was a 40% lesion.  In the ostium of  the OM-1 appeared to be jailed by the stent.  There was approximately a  50% lesion which was stable from previous.   Right coronary artery was a heavily calcified vessel throughout.  There  was a 60% proximal lesion and 50% mid lesion just after the RV branch  with an 89% lesion distally.   Left ventriculogram done in the RAO position showed an EF of 60-65% with  no regional wall motion abnormalities.  Abdominal aortogram showed  patent renal arteries bilaterally.  There was a very brisk runoff what  appeared high cardiac output.  There was no evidence abdominal aortic  aneurysm.  We did not visualize the iliacs well.   ASSESSMENT:  1. Three-vessel coronary artery disease with patent left circumflex      and left anterior descending stents.  2. High-grade right coronary artery stenosis as described above.  3. Normal left ventricular function with elevated left ventricular end-      diastolic pressures consistent with diastolic  dysfunction.  4. Patent renal arteries with no evidence of abdominal aortic aneurysm      and no evidence of abdominal aortic aneurysm.   PLAN/DISCUSSION:  I have reviewed the films with Dr. Riley Kill.  We will  plan a percutaneous intervention on the right coronary artery.      Bevelyn Buckles. Bensimhon, MD  Electronically Signed     DRB/MEDQ  D:  10/23/2008  T:  10/24/2008  Job:  [id number]

## 2010-08-06 NOTE — Cardiovascular Report (Signed)
[name], [name]               ACCOUNT NO.:  [id number]   MEDICAL RECORD NO.:  [id number]          PATIENT TYPE:  OIB   LOCATION:  1962                         FACILITY:  [facility]   PHYSICIAN:  Verne Carrow, MDDATE OF BIRTH:  02/03/1951   DATE OF PROCEDURE:  10/19/2007  DATE OF DISCHARGE:  10/19/2007                            CARDIAC CATHETERIZATION   PROCEDURES:  1. Left heart catheterization.  2. Selective coronary angiography.  3. Left ventricular angiogram with left heart pressures.   OPERATORS:  1. Bruce R. Juanda Chance, MD, [facility] (proctoring physician)  2. Verne Carrow, MD   INDICATIONS:  Chest pain in a patient with known coronary artery  disease.   HISTORY OF PRESENT ILLNESS:  [name] is a 60 year old Caucasian female  with a history of hypertension, hyperlipidemia, and known coronary  artery disease with a bare-metal stent placement into her mid LAD in  1997.  Subsequent heart catheterization in 1999 showed a patent stent  and no other obstructive disease.  She underwent a stress test is 2001,  which was negative.  She has had no further cardiac workup, but started  having chest discomfort over the last several weeks.  She states that  this discomfort has been occurring on a daily basis.  She has been  taking nitroglycerin, which does seem to relieve the pain.   DETAILS OF PROCEDURE:  The patient was brought to the Heart  Catheterization Laboratory after signing informed consent.  Her right  groin was prepped and draped in sterile fashion.  A 4-French arterial  sheath was inserted into the right femoral artery.  A JL4 and 3DRC  diagnostic catheter was used to engage the coronary arteries.  Following  injection of the coronary arteries, a left ventricular angiogram was  obtained.  Based on the results of the catheterization, the patient was  taken to recovery and her sheath was left in place.  Plans are to take  her to the second floor catheterization lab  where we will perform  percutaneous coronary intervention of a lesion in her mid LAD.  The  patient tolerated the diagnostic heart catheterization well without  complication.   FINDINGS:  Left main coronary artery was normal.   The left anterior descending coronary artery gives off a large septal  perforator that is free of disease.  In the proximal portion of the LAD,  there is a mild plaque disease noted.  There is a stent in the mid  portion of the left anterior descending artery, which shows mild-to-  moderate in-stent restenosis around 40%.  Just distal to the stented  segment is an area of stenosis between 80-90%.  The remainder of the  left anterior descending artery shows minor plaque disease.  There is a  small diagonal branch that arises from the distal LAD.   The left circumflex coronary artery gives rise to a large bifurcating  first obtuse marginal artery.  There is disease at the bifurcation point  of this large obtuse marginal in the 70-80% range.   The right coronary artery is a large dominant artery that gives rise to  a posterior descending artery as well as a large posterolateral branch.  There is a 60% stenosis in the proximal portion of the RCA.  There is a  60-70% stenosis in the distal portion of the RCA.   Left ventricular angiogram shows an ejection fraction of 50-55%.  There  is an apical wall motion abnormality suggestive of disease in the LAD  distribution.  Left ventricular pressure was 169/27.   CONCLUSION:  1. Coronary artery disease with a new significant lesion in the mid      LAD.  2. Obstructive coronary disease in the right coronary artery and the      obtuse marginal coronary artery.  3. Segmental wall motion abnormality of the left ventricle with      elevated diastolic pressures, but an overall preserved ejection      fraction.   RECOMMENDATIONS:  1. The patient will be taken to the second floor Heart Catheterization      Laboratory where  we will perform a percutaneous coronary      intervention of the mid LAD.  We will also take a closer look at      the disease at the bifurcation point of the large obtuse marginal      coronary artery.  2. The patient will have a sheath in place and will receive 3,000      units of IV heparin now.  She was given Plavix 600 mg prior to      leaving the Diagnostic Heart Catheterization Laboratory.      Verne Carrow, MD  Electronically Signed     CM/MEDQ  D:  10/19/2007  T:  10/19/2007  Job:  [id number]
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
## 2010-08-06 NOTE — Cardiovascular Report (Signed)
[name], SASAKI                ACCOUNT NO.:  [id number]   MEDICAL RECORD NO.:  [id number]           PATIENT TYPE:   LOCATION:                                 FACILITY:   PHYSICIAN:  Arturo Morton. Riley Kill, MD, FACCDATE OF BIRTH:  [birth year]   DATE OF PROCEDURE:  DATE OF DISCHARGE:                            CARDIAC CATHETERIZATION   INDICATIONS:  The patient is a 60 year old with coronary artery disease  with previous stenting of 2 vessels.  The patient smokes and has  dyslipidemia.  She has hypertension, chronic tobacco use.  She presented  with recurrent substernal chest pain.  She underwent catheterization by  Dr. Gala Romney demonstrating continued patency of the LAD and circumflex  stents.  She had what appeared to be progression of the RCA.  Percutaneous coronary intervention was recommended.  We discussed this  with the patient, and she was insistent on proceeding at this point.  Femoral angiogram had previously demonstrated and cannulation of, what  looks like, superficial femoral artery.  We did exchange 5-French sheath  for a 6-French sheath.  Bivalirudin was given according to protocol.  A  JR4 guiding catheter with side holes was utilized.  The guide had good  coaxial fit.  We initially placed a Luge wire, and predilatation was  done at the distal lesion.  We were unable to get a stent down past the  midvessel.  As a result, multiple dilatations were done in the most  severe lesion in the distal aspect of the midvessel.  This resulted in  improvement.  Using a double wire technique, we were subsequently able  to get a stent down.  We then tried to place the second stent in the mid  vessel at the lesion site, but we had quite a bit of difficulty getting  back down the artery even with a dual wire technique.  As we were  approaching the element of the fluoro time, as well as radiation, as  well as contrast load, we elected to treat the proximal lesion.  This  was covered  initially with a 3-0 balloon and then a 3.5 x 18 XIENCE V  drug-eluting stent.  This was deployed at about 14 atmospheres.  Post  dilatation of this lesion was then done using a 4-0 Quantum Apex  balloon.  We then went back down to the distal lesion with double wire  technique as this is the only thing that could get the balloon down and  we were able to get a 3.25-mm balloon finally down of the stent site,  post dilatation was done here as well.  It was my feeling that the  midvessel should probably be treated as well; however, the mid distal  lesion is not critical, and the mid section appears to be chronic.  This  could potentially be covered with 1 stent if we can get it across with  better backup.  Because of the limitations on radiation and contrast  load, we elected to complete the procedure with the plan to bring the  patient back to the catheterization laboratory in approximately 2  days  to place the third stent.  The patient required an extraordinary amount  of both intravenous midazolam, as well as IV fentanyl, for a combination  of both back pain and restlessness.  The total fluoro time was  approximately 39.5 minutes.  The contrast load was approximately 400 mL.  There were no major complications.   ANGIOGRAPHIC DATA:  The right coronary artery is a heavily calcified  vessel.  It is also tortuous in the midportion.  The proximal and mid  junction has about a 70% area of segmental narrowing.  Following  stenting with post dilatation, this has reduced to less than 10%  residual luminal narrowing.  The midvessel has some haziness and  eccentricity, and in the RAO views, this appears to be an eccentric  narrowing.  There is also 70% narrowing just after an RV branch, which  was dilated to probably about 50% and it did appear to be stable.  The  distal lesion is approximately 90%, reduced to 0%.  The posterior  descending and posterolateral branches are large-caliber vessels,  free  of critical disease.   CONCLUSIONS:  Successful percutaneous stenting of the proximal and  distal right coronary arteries with residual disease involving the  midvessel.   DISPOSITION:  The midvessel did appear to be stable.  Normally, I would  like to complete this in a single setting, but the patient was restless  and reached a moderately high fluoro time and also had reached a  significant contrast  load.  The midvessel did appear to be stable.  We elected to consider  bringing her back for a completion of the procedure in approximately 2  days.  At that time, we will likely employ an Amplatz guide and a dual  wire technique.  Predilatation with a 3-mm noncompliant balloon should  be satisfactory for helping [place] get a stent down.      Arturo Morton. Riley Kill, MD, [facility]  Electronically Signed     TDS/MEDQ  D:  10/23/2008  T:  10/24/2008  Job:  [id number]

## 2010-08-06 NOTE — Cardiovascular Report (Signed)
NAMESABRIEL, BORROMEO               ACCOUNT NO.:  [id number]   MEDICAL RECORD NO.:  [id number]          PATIENT TYPE:  INP   LOCATION:  2925                         FACILITY:  [facility]   PHYSICIAN:  Verne Carrow, MDDATE OF BIRTH:  1951-02-18   DATE OF PROCEDURE:  10/19/2007  DATE OF DISCHARGE:                            CARDIAC CATHETERIZATION   PROCEDURE PERFORMED:  Percutaneous coronary intervention with placement  of PROMUS drug-eluting stent to mid left anterior descending coronary  artery.   OPERATORS:  1. Bruce R. Juanda Chance, MD, [facility] (scrubbed and as proctoring physician).  2. Verne Carrow, MD.   DETAILS OF PROCEDURE:  Ms. Kadar is a pleasant 60 year old Caucasian  female with a history of hypertension, hyperlipidemia, and known  coronary artery disease who has had recent onset of unstable angina and  was brought to the Outpatient Cardiac Catheterization Laboratory earlier  this morning, where she was found to have a significant stenosis of her  mid left anterior descending coronary artery.  She was then transferred  to the second floor cardiac catheterization laboratory for a planned  percutaneous coronary intervention.  The 4-French sheath was exchanged  for a 6-French sheath under sterile conditions.  A Q 3.5 coronary guide  was inserted into the left main coronary artery.  The left anterior  descending coronary artery was engaged with [place] intracoronary wire.  A 2.25 mm x 12 mm apex intracoronary balloon was then passed down to the  mid left anterior descending coronary artery and used for predilatation.  Following the initial balloon inflation, there was a dissection that  extended down the LAD.  Another balloon inflation was performed with a  2.25 x 12 mm apex balloon.  There was moderate flow restored down the  LAD at this time.  A 2.5 mm x 18 mm PROMUS drug-eluting stent was then  passed to the area of stenosis.  After inflation of the stent, the  dissection was covered, and there was good flow down the LAD.  Then a  2.75 mm x 15 mm Voyager Mabscott balloon was used for postdilatation.  There  was an excellent angiographic results with TIMI 3 flow down the vessel  at the end of that procedure.  There was no residual stenosis of the  lesion following the intervention.  Following the coronary intervention,  we did focus on an injection of the obtuse marginal to better define the  stenosis.  It was felt that this stenosis was 80% and would require  intervention at a later date.   Angiomax was used for anticoagulation during the procedure.  The patient  had been given 600 mg of Plavix p.o. prior to the intervention.  She  will be maintained on Plavix 75 mg once daily.   IMPRESSION:  1. Coronary artery disease.  2. Status post percutaneous coronary intervention with placement of a      drug-eluting stent to mid left anterior descending coronary artery.  3. A 80% stenosis of first obtuse marginal coronary artery.   RECOMMENDATIONS:  1. The patient will be admitted to Telemetry Unit and watched  overnight.  2. We will continue her current medications to include Plavix 75 mg      once daily.  3. There will be a planned staged intervention to be performed on the      obtuse marginal coronary artery on Thursday, October 21, 2007.      Verne Carrow, MD  Electronically Signed     CM/MEDQ  D:  10/19/2007  T:  10/20/2007  Job:  [id number]

## 2010-08-06 NOTE — Cardiovascular Report (Signed)
NAMEYOLTZIN, RANSOM                ACCOUNT NO.:  [id number]   MEDICAL RECORD NO.:  [id number]          PATIENT TYPE:  INP   LOCATION:  2504                         FACILITY:  [facility]   PHYSICIAN:  Verne Carrow, MDDATE OF BIRTH:  20-Sep-1950   DATE OF PROCEDURE:  11/22/2008  DATE OF DISCHARGE:                            CARDIAC CATHETERIZATION   PRIMARY CARDIOLOGIST:  Arturo Morton. Riley Kill, MD, [facility]   PROCEDURES PERFORMED:  1. Left heart catheterization.  2. Selective coronary angiography.  3. Left ventricular angiogram.   OPERATOR:  Verne Carrow, MD   INDICATION:  Chest pain in a 60 year old Caucasian female with known  coronary artery disease with prior placement of stents in the right  coronary artery, the left anterior descending coronary artery and the  circumflex coronary artery.  The patient was readmitted with chest pain  and had negative cardiac enzymes.   DETAILS OF PROCEDURE:  The patient was brought to the Main Cardiac Cath  Lab after signing informed consent for the procedure.  The right groin  was prepped and draped in a sterile fashion.  Lidocaine 1% was used for  local anesthesia.  A 5-French sheath was inserted into the right femoral  artery without difficulty.  Standard diagnostic catheters were used to  perform selective coronary angiography.  A pigtail catheter was used to  perform a left ventricular angiogram.  The patient tolerated the  procedure well and taken to the holding area in stable condition.   HEMODYNAMIC FINDINGS:  Central aortic pressure 127/57, left ventricular  pressure 131/14, left ventricular end-diastolic pressure 29.   ANGIOGRAPHIC FINDINGS:  1. The left main coronary artery had no evidence of significant      disease.  2. The left anterior descending is a large vessel that courses to the      apex and gives off a large septal perforator branch and several      very small diagonal branches.  There appears to be overlapping   stents in the midportion of the vessel.  These stents are patent in      the mid vessel.  The proximal portion of the vessel has a 20%      plaque.  The distal portion of the vessel has luminal      irregularities, but no obstructive lesions.  The vessel is heavily      calcified.  3. The circumflex artery has a proximal 40% calcified stenosis.  There      appears to be a patent stent in the midportion of the circumflex.      There is a small obtuse marginal branch that has 50% ostial      stenosis and plaque disease.  This is unchanged from the prior      study.  4. The right coronary artery is a large dominant vessel that has      patent stents in the proximal mid and distal portions of the      vessel.  Runoff into the posterior descending artery and      posterolateral branch is excellent without any evidence of flow-  limiting lesions.  5. A hand injection was performed into the left ventricle secondary to      the elevated end-diastolic pressure.  The angiogram was taken in      the RAO projection.  The left ventricular systolic function      appeared to be normal.  Ejection fraction was 65%.   IMPRESSION:  1. Triple-vessel coronary artery disease with patent stents in the mid      left anterior descending, the mid circumflex, and the proximal, mid      and distal right coronary artery.  2. Normal left ventricular systolic function.   RECOMMENDATIONS:  I recommend continued medical management at this time.  The patient has a noncardiac etiology of her chest pain.  She has been  seen by the Gastroenterology division and plans have been made for a  colonoscopy and an upper endoscopy especially given her heme-positive  stools.  We will also place a consultation to Physical Medicine and  Rehabilitation for asssistance with pain management.      Verne Carrow, MD  Electronically Signed     CM/MEDQ  D:  11/22/2008  T:  11/23/2008  Job:  [id number]

## 2010-08-06 NOTE — Assessment & Plan Note (Signed)
[facility]                       [place] CARDIOLOGY OFFICE NOTE   NAME:HAYESBarbara [name]                      MRN:          [id number]  DATE:04/13/2008                            DOB:          30-Oct-1950    CARDIOLOGIST:  Gerrit Friends. Dietrich Pates, MD, [facility].   PRIMARY CARE PHYSICIAN:  [name]. [name], MD   REASON FOR VISIT:  Chest pain.   HISTORY OF PRESENT ILLNESS:  [name] is a 60 year old female patient  with a history of coronary artery disease status post stenting to the  LAD in 1997 and recent two-vessel PCI in July 2009 (PROMUS drug-eluting  stent to the mid LAD and PROMUS drug-eluting stent to the left  circumflex) who recently presented to Dr. Dietrich Pates after being seen by  her optometrist in [place].  There was some concern for retinal hemorrhage  and she was set up for carotid Dopplers.  These were performed at  [facility] on March 28, 2008, and demonstrated 50-69% bilateral  ICA stenosis.  She was seen back by the optometrist recently who noted  that signs were worse in her right eye.  According to the note I have,  there is concern for ischemia of the retina in the right eye.  She is  actually pending referral to a retinal specialist.  In the office today,  she notes 2 other problems.  She notes significant lower extremity  swelling on the left.  This has been present for 1-2 months.  She also  notes significant chest discomfort.  This is a substernal chest pain  that radiates through to her back.  She describes sharp discomfort.  This is associated with nausea, lightheadedness/near-syncope, and  shortness of breath.  She has been sleeping on a couple of pillows for  the last 3 months.  She denies paroxysmal nocturnal dyspnea.  She tells  me that her symptoms are exactly like her symptoms that she had prior to  her percutaneous coronary intervention in July 2009.   CURRENT MEDICATIONS:  Synthroid 0.05 mg daily, Prozac 40 mg daily,  Mobic  7.5 mg daily, Simvastatin 80 mg daily, Norvasc 5 mg daily, Aspirin 325  mg daily, Plavix 75 mg daily, Zantac daily,  Lopressor 25 mg b.i.d., Tylenol p.r.n., Nitroglycerin p.r.n.   ALLERGIES:  No known drug allergies.   SOCIAL HISTORY:  She continues to smoke a few cigarettes a day.   REVIEW OF SYSTEMS:  Please see HPI.  She denies fevers, chills, melena,  hematochezia, hematuria, dysuria.  Rest of the review of systems are  negative.   PHYSICAL EXAMINATION:  GENERAL:  She is a well-nourished, well-developed  female in no distress.  VITAL SIGNS:  Blood pressure is 156/82, pulse 64, weight 229 pounds.  HEENT:  Normal.  NECK:  Without JVD.  CARDIAC:  S1 and S2.  Regular rate and rhythm.  LUNGS:  Clear to auscultation bilaterally.  ABDOMEN:  Soft, nontender.  EXTREMITIES:  2+ pitting edema on the left, 1+ edema on the right.  Distal pulses are intact.  VASCULAR:  Dorsalis pedis pulses are 2+ bilaterally.  NEUROLOGIC:  She is  alert and oriented x3.  Cranial nerves II through  XII are grossly intact.   Electrocardiogram today demonstrates sinus rhythm, heart rate of 62,  right bundle-branch block.  No significant change since previous  tracing.   ASSESSMENT/PLAN:  1. Chest discomfort in a 60 year old female with a history of coronary      artery disease status post intervention to the LAD in 1997 and      recent two-vessel PCI with PROMUS drug-eluting stent to the mid      circumflex, angioplasty to the first obtuse marginal artery, and      PROMUS drug-eluting stent placed to the mid LAD with overall      preserved LV function.  Her symptoms recently are exactly like what      she had previously.  She was also interviewed by Dr. Dietrich Pates      today.  We plan on pursuing re-look cardiac catheterization to      assess for stent patency and rule out progression of disease      elsewhere.  However, we will need to see the patient back in      followup prior to arranging her  cardiac catheterization as outlined      below.  2. Recent evidence of right retinal ischemia.  Her recent carotid      Dopplers were negative.  She sees a retinal specialist next week.      We will defer any further workup to the retinal specialist.  We      will see her back in followup after she is evaluated.  3. Left lower extremity swelling.  She will be set up for bilateral      venous Dopplers today to rule out the possibility of DVT.  4. Hypertension.  This is uncontrolled.  I will increase her Norvasc      to 10 mg a day.  This well help as an antianginal as well.  5. Dyslipidemia.  She will continue on simvastatin 80 mg a day.  6. Hypothyroidism.  She continues on Synthroid.  7. Tobacco abuse.  She has decreased her use to just a few cigarettes      a day.  Hopefully, she can quit soon.   DISPOSITION:  The patient will follow up late next week after she sees a  retinal specialist.  At that time, hopefully we can arrange for cardiac  catheterization.     Tereso Newcomer, PA-C  Electronically Signed      Gerrit Friends. Dietrich Pates, MD, [facility]  Electronically Signed   SW/MedQ  DD: 04/13/2008  DT: 04/14/2008  Job #: [id number]   cc:   [name]. [name], MD  Dr. Corbin Ade

## 2010-08-06 NOTE — Consult Note (Signed)
[name], [name]                ACCOUNT NO.:  [id number]   MEDICAL RECORD NO.:  [id number]          PATIENT TYPE:  INP   LOCATION:  2911                         FACILITY:  [facility]   PHYSICIAN:  Arturo Morton. Riley Kill, MD, FACCDATE OF BIRTH:  [birth year]   DATE OF CONSULTATION:  DATE OF DISCHARGE:                                 CONSULTATION   INDICATIONS:  [name] is a 60 year old who presents with unstable angina.  She underwent diagnostic catheterization by Dr. Gala Romney.  This  demonstrated continued patency of her stents in her circumflex and LAD.  However, her right coronary artery had two fairly high grade lesions in  the proximal and distal vessel.  She also had disease in the mid vessel.  The proximal and distal vessels were subsequently stented, but it was a  difficult procedure getting the stent down to the distal lesion.  As a  result, given the radiation load, and also the contrast load, we elected  to defer the mid vessel, which did not appear to be critical until 2  days after the original procedure.  She was brought back to the  catheterization laboratory today after careful discussion.  Informed  consent was obtained.   PROCEDURE:  Percutaneous stenting of the mid right coronary artery.   DESCRIPTION OF THE PROCEDURE:  The patient was brought to the cath lab  and prepped and draped in the usual fashion.  We chose the left groin  today.  A 6-French sheath was placed without difficulty.  Bivalirudin  was given according to protocol and the ACT was checked and found to be  appropriate for percutaneous coronary intervention.  Following this, we  elected to use an Amplatz guide.  We initially placed the Amplatz guide  down the vessel.  We subsequently passed a wire across the lesion with a  traverse wire, and then dilatation was done with a 2.25-mm balloon.  We  did have trouble then passing a stent.  We applied a second wire, but  still had trouble getting the stent down.  In  removing the stent, the  proximal edges of the stent times were flailed and as a result, the  stent was removed.  We then pulled the wire back and redirected a BMW  wire down the vessel.  Predilatation was then done and then we were  subsequently able to pass a 3.0 x 23 Xience V drug-eluting stent to the  lesion.  This was then deployed at approximately 15 atmospheres.  This  was followed by 3.5 Walker Voyager balloon, which was used for post  dilatation up to 12 atmospheres throughout the course of the stent.  There were no major complications.  She did require a fair amount of  both narcotic and benzodiazepine, but this was similar to the procedure  done 2 days earlier.  She was taken to the holding area in satisfactory  clinical condition.   ANGIOGRAPHIC DATA:  The right coronary artery is a very large caliber  vessel.  It is also heavily calcified.  From the previous study, there  is a stent placed in  the proximal vessel, which appears to be widely  patent.  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  Distally, drug-eluting stent has been previously placed,  and this appears to be widely patent.  There is mild irregularity at the  bifurcation with the ostium of the continuation branch having about 30-  40%.  There is mild luminal irregularity of the proximal PDA as well.   CONCLUSIONS:  1. Heavily calcified right coronary artery with third drug-eluting      stent placed in the mid vessel.  Please see the previous reports.      Final angiographic result was excellent.  The proximal site and the      distal site were widely patent.  Of note, on removal of the      balloon, we did redilate the proximal stent to make sure that stent      was fully deployed.   1. Successful percutaneous stenting of the mid right coronary artery.   DISPOSITION:  The patient will be treated  medically.  She will require  aspirin and Plavix continuously.  Discontinuation of cigarette smoking  is strongly advised.  I did review her films with her husband so that he  would be familiar with the current anatomic findings.      Arturo Morton. Riley Kill, MD, [facility]  Electronically Signed     TDS/MEDQ  D:  10/25/2008  T:  10/26/2008  Job:  [id number]   cc:   Gerrit Friends. Dietrich Pates, MD, [facility]  CV Laboratory  Dr. Bradly Bienenstock

## 2010-08-06 NOTE — H&P (Signed)
NAMEDESARE, DUDDY                ACCOUNT NO.:  [id number]   MEDICAL RECORD NO.:  [id number]          PATIENT TYPE:  INP   LOCATION:  2911                         FACILITY:  [facility]   PHYSICIAN:  Maryla Morrow, MD        DATE OF BIRTH:  June 16, 1950   DATE OF ADMISSION:  10/22/2008  DATE OF DISCHARGE:                              HISTORY & PHYSICAL   PRIMARY CARE PHYSICIAN:  Lia Hopping, MD   PRIMARY CARDIOLOGIST:  Gerrit Friends. Dietrich Pates, MD, [facility] at [facility] office.   CHIEF COMPLAINT:  Transferred for unstable angina.   HISTORY OF PRESENT ILLNESS:  Ms. 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  Charlies Constable in July 2009 as a stage procedure as well as history of  tobacco abuse, hypertension, dyspnea, hypothyroidism who presented to  the [facility] here this morning with chief complaint of  sudden onset of chest pain while in bed.  The patient states that she  finished her overnight shift at the nursing home in [place] and came  back home.  She had some cheese and crackers from the refrigerator and  went back to sleep.  She woke up while lying down in the bed around 10  o' clock.  Complained of chest pain and called her husband who was at  church.  She was immediately rushed to the EMS after having this  episode.  Her chest pain was precordial in location, 10/10 in intensity  with radiation to left arm.  There was no associated diaphoresis or  nausea.  She did admit to however feeling sick to her stomach.  She  denies any fever or chills.  She did complain of some lower extremity  edema but denied any cough, PND or orthopnea.  Per her husband, she also  had 2 episodes of chest pain while at work last night and night before  last.  She has been compliant  with her Plavix and other medications as  detailed below.   PAST MEDICAL HISTORY:  1. Status post staged PCI to LAD, left circ and obtuse marginal in      July 2009 as well as PCI in the past to LAD 1999.  2. Hypertension.  3. Tobacco abuse.  4. Dyslipidemia.  5. Hypothyroidism.   PAST SURGICAL HISTORY:  Left heart catheterization in July 2009 and  1999.   FAMILY MEDICAL HISTORY:  None   ALLERGIES:  No known drug allergies.   SOCIAL HISTORY:  The patient continues to smoke few cigarettes a day.  Denies any alcohol or drug abuse.  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  She is married.   CURRENT MEDICATIONS:  1. Synthroid 50 mcg daily.  2. Prozac 40 mg once daily.  3. Mobic 7.5 mg once daily.  4. Simvastatin 80 mg once  daily.  5. Zantac 150 mg at bedtime.  6. Aspirin 325 mg daily.  7. Norvasc 5 mg daily.  8. Plavix 75 mg daily.  9. Lopressor 25 mg b.i.d.  10.Nitroglycerin as needed.  11.Tylenol as needed.   REVIEW OF SYSTEMS:  All pertinent positive and negative as noted in the  HPI, rest was negative for patient complete 12-point review of systems  performed.   PHYSICAL EXAMINATION:  GENERAL:  Reveals a 60 year old white female  current to time, place and person in no acute apparent distress.  VITAL SIGNS:  Pulse of 72 per minute, saturation 97% on oxygen, blood  pressure is 110/45, respiratory rate 16 per minute.  She is afebrile.  HEENT:  Pupils equally round and reactive to light.  Extraocular  movements intact.  Head is atraumatic and normocephalic.  Oropharynx is  clear.  NECK:  No JVD.  No lymphadenopathy.  CHEST:  Auscultation clear bilaterally.  Equal expansion.  HEART:  Regular rate and rhythm.  S1 and S2 normal.  Positive 2/6  systolic murmur at the base with no respiratory variation.  ABDOMEN:  Soft, nontender.  Bowel sounds are present.  EXTREMITIES:  Trace nonpitting edema in the lower extremities, 1+.   Distal pulses are 2+ bilaterally.  Strength is 5/5 in all extremities.  Sensation is intact.  Speech is normal.  PSYCHIATRIC:  Mood and affect seemed to be normal.  The patient is  currently sleeping because she got some Ativan at [facility].   PERTINENT LABS AND DIAGNOSTIC DATA:  The patient's first set of cardiac  enzymes have been negative.  Her chemistry profile and CBC has been  unremarkable.  A 12-lead EKG revealed right bundle-branch block with  heart rate of 82 beats per minute with no evidence of any ST changes or  ischemia or injury.   ASSESSMENT/PLAN:  Ms. Galyon is a 60 year old white female with:  1. Unstable angina.  The patient will be a high risk for instant      restenosis considering her past medical history and active risk      factors.  Thereby, seemed to be reasonable to continue her on her      heparin drip and continue her nitroglycerin drip.  I will also      reload with Plavix 300 and continue Plavix 75 mg daily.  2. History of coronary artery disease status post percutaneous      coronary intervention to left anterior descending first in 1999,      from a staged percutaneous coronary intervention to left anterior      descending left circ first obtuse marginal in July 2009.  3. Hypertension, controlled.  4. Tobacco abuse, active.  5. Dyslipidemia.  6. Hypothyroidism.   PLAN:  1. Admit to CCU.  The patient was placed on IV heparin drip and      continue nitroglycerin drip started in [facility] ER.  2. We will cycle cardiac enzyme and keep the patient n.p.o. after      midnight for potential left heart catheterization in the morning.  3. Reload the patient with Plavix 300 mg.  We will continue her oral      dose 75 mg daily.  4. Continue aspirin.  5. PRN medications.  6. Check a 2D echocardiogram as well as BNP in the morning for further      evaluation of LV function.  7. Continue beta-blocker and check fasting lipid panel and  hemoglobin  A1c in the morning.      Maryla Morrow, MD  Electronically Signed     AP/MEDQ  D:  10/22/2008  T:  10/23/2008  Job:  [id number]   cc:   Avanell Shackleton. Dietrich Pates, MD, [facility]

## 2010-08-06 NOTE — Discharge Summary (Signed)
[name], [name]                ACCOUNT NO.:  [id number]   MEDICAL RECORD NO.:  [id number]          PATIENT TYPE:  INP   LOCATION:  2009                         FACILITY:  [facility]   PHYSICIAN:  Gerrit Friends. Dietrich Pates, MD, FACCDATE OF BIRTH:  September 03, 1950   DATE OF ADMISSION:  10/22/2008  DATE OF DISCHARGE:  11/04/2008                               DISCHARGE SUMMARY   PRIMARY CARE PHYSICIAN:  Dr. Lia Hopping.   PRIMARY CARDIOLOGIST:  Gerrit Friends. Dietrich Pates, MD, [facility]   NEUROLOGY:  The patient was evaluated by Dr. Vickey Huger.   DISCHARGING DIAGNOSES:  1. Coronary artery disease, status post cardiac catheterization this      admission.  The patient with three-vessel coronary artery disease      with patent left circumflex and left anterior descending stents.      High-grade right coronary artery stenosis, normal left ventricular      function with mild diastolic dysfunction, and patent renal artery      showed no evidence of abdominal aortic aneurysm.  2. Significant right lower extremity pain in the days following her      catheterization.  The patient worked up, CT scan showing no      retroperitoneal hematoma.  She did have a small amount of bleeding,      but no large hematoma to find.  Lower extremity Dopplers negative      for DVT or superficial thrombus, questionable Baker cyst on right      lower extremity behind the knee.  Neuro consult also obtained.  The      patient felt to have a compression or irritation of the femoral      nerve on the right thigh due to small hematoma.  Recommend a nerve      conduction study with EMG outpatient, possibly rehab.  Encourage      mobilization at this time for absorption of hematoma and low-dose      narcotic therapy.  3. Urinary tract infection, treated with Cipro.  4. Ongoing tobacco abuse.  5. Morbid obesity.  6. Status post 2-D echocardiogram showing normal ejection fraction.  7. Low pain threshold with the patient requiring multiple  narcotics      this admission for comfort.  8. Back pain, status post lumbar spine showing osteopenia,      spondylosis, and stable L1 compression fracture.   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  She has a history of  hypertension, hyperlipidemia, ongoing tobacco abuse, and coronary artery  disease.  She underwent a drug-eluting stent in LAD and circumflex in  July 2009.  She has recently been having increased chest pain, and was  admitted for further workup.  She also has a history of hypothyroidism  and chronic back pain.  The patient underwent cardiac catheterization on  October 23, 2008.  The patient with high-grade right coronary artery  stenosis, returned to the cath lab for successful percutaneous stenting  of the proximal and distal RCA arteries with residual disease involving  the  mid vessel.  The patient returned to the cath lab on October 25, 2008,  for treatment/PCI of mid RCA with a XIENCE drug-eluting stent placed.  The patient continued to complain of chest discomfort and eventually  complained of pain all over, specifically left flank pain, right groin  pain, lower back pain, and right thigh pain.  The patient underwent CT  scan that ruled out retroperitoneal bleed, small hematoma noted.  Hemoglobin stable at 8.8.  The patient was given 1 unit of packed RBCs  and monitored closely.  By August 7, the patient continued to complain  of pain and requested that she be given Nubain specifically.  Her  hemoglobin was stable at 10.1 and creatinine 0.7.  The patient underwent  ultrasound of lower extremities, ruled out for DVT, continued to  complain of pain, and refused to ambulate.  The patient demanded to keep  Foley catheter in, although she is positive for urinary tract infection.  We began treatment with Cipro.  The patient continued to require IV  morphine and Nubain for pain, difficulty with IV  placement.  The patient  requesting PICC line.  We deferred at this time.  Ultimately, required  Valium for leg and muscle discomfort in the right lower extremity along  with p.o. Percocet.  The patient eventually was able to ambulate in the  hall to the nurse's station to request coffee.  She, however, continued  to request narcotics for pain, and then requested a Lidoderm patch for  discomfort also.  We asked Neurology to evaluate the patient secondary  to above situation.  Dr. Vickey Huger saw the patient in consultation.  The  patient denied any further chest discomfort, but continued to complain  of back and lower extremity discomfort.  Dr. Vickey Huger saw the patient in  consultation on August 13 with recommendations for a nerve conduction  study with EMG outpatient.  She requested that we continue the Percocet  q.4 h. with a total dose of 5 a day, upper limit, and asked the patient  either call her local neurologist in [place] or the [facility] for followup nerve conduction study with EMG.  The patient  developed low-grade fever in the setting of Foley catheter placement.  Note, the patient, however, had refused previously to have the Foley  catheter removed.  She did respond to Cipro with improvement in flank  pain.  WBCs were elevated at 17.8.  Blood cultures were negative.  Dr.  Riley Kill also saw the patient in consultation for pain management with  recommendations to continue the Percocet and discontinue the morphine  and Talwin.  Also, continue the Lidoderm patch and use moist heat to  right thigh and calf when Lidoderm patches are off.  The patient  verbalized understanding of these instructions and agreed.  Dr. Dietrich Pates  in to see the patient on day of discharge.  The patient walking with  walker, eager for discharge home, low-grade temperature 99.4.  Blood  cultures negative.  The patient will follow up as outpatient at the  [facility] office.  I will have office  call the patient with date and  time.  Home health for physical therapy and walker.   MEDICATIONS AT THE TIME OF DISCHARGE:  1. Celebrex 100 mg b.i.d.  2. Cipro 500 mg b.i.d.  3. Celexa 40 mg daily.  4. Furosemide 40 mg daily.  5. Isosorbide mononitrate 60 mg daily.  6. Lidocaine patch one-half patch to back and one-half patch to right  calf topically daily, remove at night.  Do not use with heat.  7. Robaxin 500 mg q.6 h p.r.n.  8. Nicotine patch 21 mg patch transdermally 24 hours.  9. Nystatin 1000 units powder topically p.r.n.  10.Percocet 1-2 tablets every 6 hours as needed.  11.Ambien 5 mg at bedtime as needed.  12.Flonase nasally 3 times a day.  13.Aspirin 325 daily.  14.Lopressor 50 mg half a tablet b.i.d.  15.Multivitamin daily.  16.Nexium twice a day.  17.Nitroglycerin p.r.n.  18.Norvasc 10 mg daily.  19.Plavix 75 mg daily.  20.Prozac 40 mg daily.  21.Synthroid 50 mcg daily.  22.Xanax 0.5 mg q.i.d. p.r.n.  23.Zocor 80 mg daily.   Note, the patient has been given prescriptions for her narcotics and  sleeping medicines.  There will not be any refills through [facility]  [facility].  She will need to follow up with her primary care or  Neurology for further management.   DURATION OF DISCHARGE/ENCOUNTER:  Well over 30 minutes.      Dorian Pod, ACNP      Gerrit Friends. Dietrich Pates, MD, [facility]  Electronically Signed    MB/MEDQ  D:  11/04/2008  T:  11/04/2008  Job:  [id number]   cc:   Harley Alto, M.D.

## 2010-08-06 NOTE — Letter (Signed)
February 28, 2008    [name]. Stacy Gambler, MD  [address]  [place], [place] [address]   RE:  [name], Stacy  MRN:  [id number]  /  DOB:  12-16-1950   Dear [name],   [name] returns to the office for continued assessment and treatment  of coronary artery disease.  She has had no recurrent angina.  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  She has had  problems with recurrent headaches.  She is confused about her lipid-  lowering medication.  She was suppose to be taking simvastatin 40 mg  daily, but believes that she is taking pravastatin.  Her other  medications are unchanged from her last visit.   She was recently evaluated by an ophthalmologist, who told her she had a  retinal hemorrhage.  He attributed this to her vascular disease.  Unfortunately, she continues to smoke cigarettes.  She claims to have  tapered to one-fifth pack per day.   PHYSICAL EXAMINATION:  GENERAL:  A pleasant woman with a raspy voice, in  no acute distress.  VITAL SIGNS:  The weight is 207, 5 pounds more than at her last visit.  Blood pressure 130/80, heart rate 72 and regular, and respirations 14.  NECK:  No jugular venous distention; bilateral carotid bruits.  LUNGS:  Decreased breath sounds; otherwise clear.  CARDIAC:  Distant first and second heart sounds; modest systolic murmur.  ABDOMEN:  Soft and nontender; no organomegaly.  EXTREMITIES:  Distal pulses intact; no edema.   IMPRESSION:  [name] appears to be doing well symptomatically with  respect to coronary artery disease.  She can obtain simvastatin at [facility]  for low price.  We will resume 80 mg daily and check a lipid profile in  1 month as well as a chemistry profile.  She is encouraged to  completely discontinue cigarette smoking.  Blood pressure control is  adequate.  She had hyponatremia in the hospital when she required  coronary intervention - this has resolved.  Her modest anemia has also  improved.  A carotid ultrasound  study will be obtained in light of her  bruits and retinal hemorrhage.  I will plan to see this nice woman again  in 6 months.    Sincerely,      Gerrit Friends. Dietrich Pates, MD, [facility]  Electronically Signed    RMR/MedQ  DD: 02/28/2008  DT: 02/29/2008  Job #: [id number]
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
## 2010-08-06 NOTE — Discharge Summary (Signed)
[name], [name]               ACCOUNT NO.:  [id number]   MEDICAL RECORD NO.:  [id number]          PATIENT TYPE:  INP   LOCATION:  2925                         FACILITY:  [facility]   PHYSICIAN:  Nicolasa Ducking, ANP DATE OF BIRTH:  12/25/1950   DATE OF ADMISSION:  10/19/2007  DATE OF DISCHARGE:  10/22/2007                               DISCHARGE SUMMARY   PRIMARY CARDIOLOGIST:  Gerrit Friends. Dietrich Pates, MD, [facility]   PRIMARY CARE Jordynne Mccown:  [name]. [name], MD   DISCHARGE DIAGNOSIS:  Unstable angina/coronary artery disease.   SECONDARY DIAGNOSES:  Hypertension, hyperlipidemia, hypothyroidism,  obesity, and chronic back pain.   ALLERGIES:  No known drug allergies.   PROCEDURE:  Left heart cardiac catheterization with successful PCI and  stenting of the mid LAD with placement of a 2.5 x 18 mm Promus drug-  eluting stent.  PCI and stenting of the left circumflex with placement  of 2.5 x 18 mm Promus drug-eluting stent.   HISTORY OF PRESENT ILLNESS:  A 60 year old Caucasian female with prior  history of coronary artery disease status post bare-metal stenting of  the LAD in 1997.  She was in her usual state of health until October 17, 2007, when she presented to the [facility] Emergency Room with  chest and epigastric pain.  Her cardiac markers were negative and ECG  showed no acute changes.  She had previously been scheduled for an  outpatient cath following a visit with Dr. Dietrich Pates on October 15, 2007.  She was discharged home from the emergency room and presented to the  outpatient cath lab on October 19, 2007.   HOSPITAL COURSE:  The patient underwent left heart cardiac  catheterization revealing a 90% stenosis distal to the previously placed  LAD stent.  There is also a 70% stenosis in the left circumflex/OM1.  She had nonobstructive disease in her right coronary artery.  EF was 50-  55% with an apical hypokinesis.  The LAD was then successfully treated  with a 2.5 x 18 mm  Promus drug-eluting stent.  Plans were made for a  stage procedure to the left circumflex.  The patient had no recurrent  chest discomfort and underwent 2-D echocardiography on October 20, 2007,  revealing an EF of 60% with mild focal basal hypertrophy.  There was  trivial mitral regurgitation.  She was taken back to the cath lab on  October 21, 2007, where she underwent cutting balloon angioplasty of the  first obtuse marginal and then Promus drug-eluting stent placement in  the mid left circumflex.  She tolerated this procedure well.  Throughout  her hospitalization, cardiac markers have remained negative and she has  had no recurrent symptoms.  She has been ambulating and has been seen by  cardiac rehab.  She will be discharged home today in good condition.   DISCHARGE LABS:  Hemoglobin 10.1, hematocrit 30.1, WBC 9.8, platelets  275, and INR 0.9.  Sodium 130, potassium 5.1, chloride 99, CO2 25, BUN  13, creatinine 0.71, glucose 92, calcium 8.4, CK 16, MB 1.6, and  troponin I 0.11.   DISPOSITION:  The patient is being discharged home today in good  condition.   FOLLOW-UP PLANS AND APPOINTMENTS:  We have arranged for followup with  Dr. Dietrich Pates on November 04, 2007, at 10:45 a.m..  We have asked her to  followup with Dr. [name] in the next 3-4 weeks.   DISCHARGE MEDICATIONS:  1. Aspirin 325 mg daily.  2. Plavix 75 mg daily.  3. Lopressor 25 mg b.i.d.  4. Norvasc 5 mg daily.  5. Prozac 40 mg daily.  6. Synthroid 50 mcg daily.  7. Mobic 15 mg daily.  8. Zocor 40 mg q.h.s.  9. Colace 100 mg daily.  10.Voltaren 75 mg q.p.m.  11.Nitroglycerin 0.4 mg subcu p.r.n. chest pain.  12.Chantix 0.5 mg b.i.d. x4 days, then 1 mg b.i.d.  13.Percocet 5/325 mg one q.6 h. p.r.n. back pain.   Smoking cessation has been advised by ascertaining labs.   DURATION DISCHARGE ENCOUNTER:  45 minutes including physician time.      Nicolasa Ducking, ANP     CB/MEDQ  D:  10/22/2007  T:  10/23/2007  Job:   [id number]   cc:   [name]. [name], MD

## 2010-08-06 NOTE — H&P (Signed)
[name], [name]                ACCOUNT NO.:  [id number]   MEDICAL RECORD NO.:  [id number]          PATIENT TYPE:  INP   LOCATION:  2041                         FACILITY:  [facility]   PHYSICIAN:  Rollene Rotunda, MD, FACCDATE OF BIRTH:  03-Feb-1951   DATE OF ADMISSION:  11/14/2008  DATE OF DISCHARGE:                              HISTORY & PHYSICAL   PRIMARY:  Dr. [place] Bing.   REASON FOR PRESENTATION:  Patient with chest pain.   HISTORY OF PRESENT ILLNESS:  Patient is 60 years old.  She was  hospitalized earlier this month with chest discomfort and had stenting  as described below.  Unfortunately, this did not improve any of her  chest pain symptoms.  She reports that since going home she has had  almost continuous discomfort.  She reports it to be 10/10.  She points  to her right side penetrating through to her back.  It does seem to be a  little bit positional.  I was able to reproduce it with palpation over  her right chest.  She did apparently have some workup with Dr. Dietrich Pates  and with Dr. [name].  There was a chest x-ray on November 07, 2008, that  demonstrated no edema.  She reports being treated with some steroids  though I do not see this note.  She presented to the [facility]  Emergency Room today.  There she had an elevated D-dimer but a negative  perfusion study for evidence of pulmonary embolism.  She had negative  cardiac markers.  There was no acute cardiopulmonary process on chest x-  ray.  However, because of the pain and the recent past history, she was  sent back here.  She reports continued nausea.  She reports shortness of  breath.  She does report that when she ambulates her oxygen saturation  drops to 92% as documented by a physical therapist.  She thinks that her  oxygen level is dropping more at night.  She insisted that I talk with  that physical therapist.  I did speak with her and she suggested that  the patient was not describing chest pain up until  yesterday.  She  cannot confirm any oxygen saturations dropping as she is not there to  measure them at night.  She says the patient keeps her feet elevated but  not above her heart.  She reports that the patient has had a couple of  falls because she has clutter in her house and is not compliant with  recommendations for reducing fall risk.  The patient reports that the  chest discomfort she is having is exactly like the pain she had at the  time of her angioplasty that never went away.  She is not describing  throat discomfort.  She is not describing substernal discomfort or left-  sided pain.  She is not describing any new PND or orthopnea though she  sleeps with her head elevated.  She does have increasing lower extremity  swelling and she says it is worse in the left than right.  I am not sure  that  she is compliant with salt.  I do not know that she weighs herself  daily.   PAST MEDICAL HISTORY:  1. Coronary artery disease (PTCA of an LAD in 1997, Promus stenting to      a mid LAD and a plain old balloon angioplasty of an obtuse marginal      in July of 2009, catheterization August 2010 demonstrated right      coronary artery stenosis that was treated with a 3.5 x 18 Xience      stent proximally, distal right coronary artery PCI.  She had a      staged procedure with PCI of the mid right coronary artery and      treatment with a Xience stent), well-preserved ejection fraction      (55% to 65% October 23, 2008, with mild AS, mild TR, and normal wall      motion and wall thickness).  2. Hypertension.  3. Hypothyroidism.  4. Dyslipidemia.  5. Degenerative joint disease.  6. COPD.  7. Tobacco abuse (greater than 50 pack years, quitting October 21, 2008).  8. Morbid obesity.   ALLERGIES/INTOLERANCES:  NONE.   MEDICATIONS:  1. Xanax q.i.d.  2. Nicotine patch.  3. Xopenex.  4. Celebrex 100 mg b.i.d.  5. Celexa 40 mg daily.  6. Lasix 40 mg daily.  7. Imdur 60 mg daily.  8.  Lidocaine patch.  9. Robaxin.  10.Percocet.  11.Ambien.  12.Flonase.  13.Aspirin 325 mg daily.  14.Lopressor 50 mg b.i.d.  15.Multivitamin.  16.Nexium 40 mg b.i.d.  17.Nitroglycerin sublingual.  18.Norvasc 10 mg daily.  19.Plavix 75 mg daily.  20.Prozac 40 mg daily.  21.Synthroid 50 mcg daily.  22.Xanax 0.5 mg q.i.d.  23.Zocor 80 mg daily.   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  She lives in [place] with her  husband.  She quit smoking as described.  She does not drink alcohol.   FAMILY HISTORY:  Noncontributory for early coronary artery disease.   REVIEW OF SYSTEMS:  As stated in the HPI and negative for all other  systems.   PHYSICAL EXAMINATION:  The patient is chronically ill appearing but in  no acute distress.  Blood pressure 125/66.  Heart rate 75 and regular.  Respiratory rate 18.  Afebrile.  98% saturation on 2 L.  HEENT:  Eyelids unremarkable.  Pupils equal, round, and reactive to  light.  Fundi not visualized.  Oral mucosa unremarkable.  NECK:  No jugular venous distention at 45 degrees.  Carotid upstroke  brisk and symmetric.  No bruits.  No thyromegaly.  LYMPHATICS:  No cervical, axillary, or inguinal adenopathy.  LUNGS:  Clear to auscultation with decreased breath sounds.  No  wheezing.  No crackles.  BACK:  No costovertebral angle tenderness.  CHEST:  Tenderness to palpation at a point in the right mid chest,  otherwise unremarkable.  HEART:  PMI not displaced or sustained.  S1 and S2 within normal limits.  No S3.  No S4.  No clicks.  No rubs.  No murmurs.  (Distant heart  sounds).  ABDOMEN:  Morbidly obese.  Positive bowel sounds normal in frequency and  pitch.  No bruits.  No rebound.  No guarding.  No midline pulsatile  mass.  No hepatomegaly.  No splenomegaly.  SKIN:  No rashes.  No nodules.  EXTREMITIES:  Two-plus pulses throughout.  Trace bilateral lower  extremity edema.  No calf  tenderness.  There is some tenderness in the  dorsum of her left foot with perhaps some mild erythema.  NEURO:  Oriented to person, place, and time.  Cranial nerves II-XII  grossly intact.  Motor grossly intact.   LABS:  WBC 12.6, hemoglobin 9.5, platelets 366, sodium 123, potassium  4.0, BUN 18, creatinine 0.8, CK-MB, troponin negative x1, BNP 115.   EKG, sinus rhythm, rate 82, right bundle branch block, T-wave inversions  V1-V3.   ASSESSMENT AND PLAN:  1. Chest discomfort.  The patient's chest discomfort is clearly      atypical and nonanginal.  It is reproducible with palpation.  At      this point, she needs continued pain management.  We will continue      to cycle enzymes.  However, no invasive cardiac evaluation would be      suggested unless she has a clear enzyme elevation or change in her      EKG.  The etiology of this is most likely musculoskeletal and I      would not suggest further invasive evaluation of other kinds during      this admission.  2. Deconditioning.  The patient needs continued physical therapy and I      will order this.  3. Hyponatremia.  I am going to have to hold her Lasix for now.  There      were other medications that could be responsible as well but I will      start with the discontinuation of her Lasix for the short term.  We      will have to watch this closely and consider further endocrinologic      evaluation.  4. Anemia.  This is chronic following the recent hospitalization.  I      will guaiac stools.  5. Lower extremity edema.  I am going to try to keep her feet elevated      as much as possible.  Again, I will be holding the Lasix.  6. Questionable desaturation.  The patient reports this at night.  I      will have an overnight pulse oximetry.  7. Coronary disease.  She will continue with risk reduction.      Rollene Rotunda, MD, [facility]  Electronically Signed     JH/MEDQ  D:  11/14/2008  T:  11/14/2008  Job:  [id number]

## 2010-08-06 NOTE — Assessment & Plan Note (Signed)
[facility]                       [place] CARDIOLOGY OFFICE NOTE   NAME:[name]                      MRN:          [id number]  DATE:11/04/2007                            DOB:          1950-06-22    CARDIOLOGIST:  Gerrit Friends. Dietrich Pates, MD, [facility]   PRIMARY CARE PHYSICIAN:  [name]. [name], MD   REASON FOR VISIT:  Post-hospitalization followup.   HISTORY OF PRESENT ILLNESS:  [name] is a very pleasant 60 year-old  female patient with a history of coronary artery disease, status post  stenting to the LAD in 1997, who recently saw Dr. Dietrich Pates secondary to  angina.  She was set up for outpatient cardiac catheterization, which  revealed two-vessel CAD.  She was admitted for intervention.  She  underwent Promise drug-eluting stent placement to the mid LAD.  She then  underwent staged intervention of the left circumflex with a Promise drug-  eluting stent.  She has also had cutting balloon angioplasty of the  obtuse marginal #1.  Her EF was 50-55% with apical hypokinesis and left  ventriculogram.  Post catheterization course was fairly uneventful and  she was eventually discharged to home.  Of note, she has a history of  hyponatremia.  Her sodium in the hospital was 130.  She also had mild  anemia with a hemoglobin of 10.1.   The patient notes she is doing much better.  She is eager to get back to  work tonight.  She denies any recurrent chest discomfort or shortness of  breath.  She denies orthopnea, PND, or pedal edema.  She denies any  syncope or near syncope.  She denies any fevers or chills.  She notes  her groin wounds are healing well without much difficulty.  She did note  some burning on the left.   CURRENT MEDICATIONS:  1. Synthroid 0.05 mg daily.  2. Prozac 40 mg daily.  3. Mobic 7.5 mg daily.  4. Simvastatin 8 mg half tablet daily.  5. Norvasc 5 mg daily.  6. Aspirin 325 mg daily.  7. Plavix 75 mg daily.  8. Zantac as  directed.   PHYSICAL EXAMINATION:  GENERAL:  She is a well-nourished and well-  developed female in no acute distress.  VITAL SIGNS:  Blood pressure is 120/54 in the right, 98/60 in the left,  pulse 61, and weight 203 pounds.  HEENT:  Normal.  NECK:  Without JVD.  CARDIAC:  S1 and S2.  Regular rate and rhythm.  LUNGS:  Clear to auscultation bilaterally.  ABDOMEN:  Nontender.  EXTREMITIES:  Without pitting edema.  NEUROLOGIC:  She is alert and oriented x3.  Cranial II through XII are  grossly intact.  VASCULAR:  Right and left femoral arteriotomy sites without hematoma or  bruit.  SKIN:  She does have a very small area of erythematous plaquing and an  annular distribution consistent with tinea.   Electrocardiogram reveals sinus rhythm with a heart rate of 61, normal  axis, right bundle-branch block, and no acute changes.   IMPRESSION:  1. Coronary artery disease.      a.  Status post stenting to left anterior descending in 1997.      b.     Status post recent two-vessel percutaneous coronary       intervention:  Promise drug-eluting stent to the mid left anterior       descending and a promise drug-eluting stent to the left       circumflex.  2. Preserved left ventricular function with an EF of 50-55% by left      ventriculogram.  3. Dyslipidemia.      a.     Recent lipid panel on July 16, 2007:  Total cholesterol of       214, triglycerides 73, HDL 90, and LDL 109.  4. Treated hypothyroidism.  5. Tobacco abuse.      a.     The patient notes she is currently on Chantix and is eager       to quit.  6. Hypertension, controlled.  7. Hyponatremia.  8. Tinea cruris   PLAN:  1. [name] returns for followup, post intervention.  Overall, she is      doing well.  She is eager to return to work.  She is not having any      recurrent anginal symptoms, and it should be suitable for her      return to work.  I have completed FMLA forms today.  2. The patient will have a CBC and  BMET today to followup post      catheterization.  She is now on dual antiplatelet therapy.  Also,      she has had a history of hyponatremia.  3. Although her HDL is a more than optimal, her LDL remains above 70.      We will increase her simvastatin to 80 mg a day and a recheck      lipids and LFTs in 3 months.  4. She has evidence of a mild case of Tinea cruris on exam on the      left.  I have recommended nystatin powder for 7 days or until      clear.  She is to use it twice a day.  5. The patient will come back and follow up with Dr. Dietrich Pates in the      next 2 months or sooner p.r.n.      Tereso Newcomer, PA-C  Electronically Signed      Gerrit Friends. Dietrich Pates, MD, [facility]  Electronically Signed   SW/MedQ  DD: 11/04/2007  DT: 11/05/2007  Job #: [id number]

## 2010-08-09 NOTE — Discharge Summary (Signed)
[facility]  Patient:    [name], [name]                      MRN: [id number] Adm. Date:  [id number] Disc. Date: 09/30/99 Attending:  Nelta Numbers Dictator:   Tereso Newcomer, P.A. CC:         Gerrit Friends. Dietrich Pates, M.D. [facility]             Dr. Dewaine Conger                           Discharge Summary  DATE OF BIRTH:  02/12/1951.  DISCHARGE DIAGNOSES: 1. Reason for admission:  Chest pain. 2. Coronary artery disease, status post left anterior descending percutaneous    transluminal coronary angioplasty with stent by Kaleen Odea, M.D.,    in July of 1997. 3. Hypercholesterolemia. 4. Chronic back pain. 5. Hypertension. 6. Obesity. 7. Postmenopausal. 8. Status post hysterectomy. 9. Status post left rotator cuff surgery.  ADMISSION HISTORY:  This 60 year old white female with known CAD and multiple admissions for recurrent chest pain with most recent catheterization in February of 1999 showing left main with 20% discrete stenosis, proximal LAD 20 to 30% diffuse disease, overlapping stents widely patent just distal to second stent was a 40 to 50% discrete stenosis.  Circumflex coronary was nondominant with 50% discrete lesion mid vessel. RCA with 20 to 30% discrete lesion in the proximal mid portion.  EF 60%.  She presented with similar symptoms to those that she had in the past with recurrent chest pain.  Her pain was off and on all day for 18 hours.  She came to the emergency room at [facility] after working all day.  The EKG there was normal.  Chest CT at [facility] was also negative.  She took a total of 12 nitroglycerin and aspirin.  INITIAL PHYSICAL EXAMINATION:  VITAL SIGNS:  Blood pressure 115/72, pulse 67.  NECK:  There were bilateral carotid bruits noted.  CHEST:  Clear.  CARDIOVASCULAR:  There was S1 and S2, 2/6 systolic ejection murmur.  ABDOMEN:  Soft and nontender.  EXTREMITIES:  There was no  edema.  EKG showed sinus rhythm, heart rate 67 and normal.  INITIAL LABS AT [facility]:  Glucose 89, BUN 26, creatinine 1.4, sodium 133, potassium 4.4, chloride 101, CO2 16, total protein 7.1, albumin 4.1, ALT 21, alkaline phosphatase 85, total bilirubin 0.4.  CPK 23, CK-MB 0. WBC 11,600, hemoglobin 15, hematocrit 44.5, platelet count 290,000.  Chest x-ray at [facility] showed cardiomegaly, normal lung markings and normal great vessels in the mediastinum.  HOSPITAL COURSE: The patient was transferred to [facility] for further evaluation.  She had serial enzymes drawn to rule out myocardial infarction.  The first CK done at [facility]. [facility] 61, second 27; CK-MB less than 0.3 x 2; troponin less than 0.03, second one 0.03.  She also had an alcohol study performed that was less than 10. Amylase was normal at 42, lipase normal at 27.  After admission to the floor the patient still complained of chest pain and nausea. Her nausea was new.  Her blood pressure also had been noted to drop from systolic of 150 down to a systolic of 100.  She was given IV fluids.  Her Avapro and Ziac were held. Her blood pressure was noted to slowly increase  with IV fluids.  With her negative enzymes, her heparin was discontinued.  The morning of September 30, 1999, she was found to be in more stable condition without any more nausea or chest symptoms.  Blood pressure that morning was 110/40.  Her hemoglobin and hematocrit were noted to be slightly decreased at 11.5 and 33.  Therefore, stat CBC was drawn prior to discharge and was normal.  Hemoglobin was 13.3 and hematocrit was 40.1.  Therefore, it was felt she was stable enough for discharge to home.  DISCHARGE MEDICATIONS: 1. Ziac 10/12.5 q.d. 2. Lipitor 40 mg p.o. q.h.s. 3. Synthroid 0.05 mg p.o. q.d. 4. Coated aspirin 325 mg q.d. 5. Imdur 60 mg p.o. q.d. 6. Nitroglycerin 0.4 mg sublingual p.r.n. chest  pain.  SPECIAL NOTE:  The patient is to stop taking Avapro.  ACTIVITY:  As tolerated.  DIET:  Low fat, low cholesterol, low sodium diet.  FOLLOW-UP:  The patient needs a stress echocardiogram in the next one to two days.  She needs a follow-up appointment with Gerrit Friends. Dietrich Pates, M.D., in two weeks.  Thurston Hole, at the regional office has been notified, she will need to make arrangements for those appointments and she has asked me to get the patient to give her a call when she gets home to get the times and dates for those appointments.  This message has been relayed to the patient. DD:  09/30/99 TD:  09/30/99 Job: [id number]

## 2010-08-29 ENCOUNTER — Encounter: Payer: Self-pay | Admitting: Cardiology

## 2010-09-10 ENCOUNTER — Other Ambulatory Visit: Payer: Self-pay | Admitting: *Deleted

## 2010-09-10 MED ORDER — SIMVASTATIN 80 MG PO TABS: 80.0000 mg | ORAL_TABLET | Freq: Every day | ORAL | Status: DC

## 2010-09-18 ENCOUNTER — Other Ambulatory Visit: Payer: Self-pay | Admitting: *Deleted

## 2010-09-18 MED ORDER — SIMVASTATIN 80 MG PO TABS: 80.0000 mg | ORAL_TABLET | Freq: Every day | ORAL | Status: DC

## 2010-09-26 ENCOUNTER — Telehealth: Payer: Self-pay | Admitting: Cardiology

## 2010-09-26 MED ORDER — ISOSORBIDE MONONITRATE ER 60 MG PO TB24: 60.0000 mg | ORAL_TABLET | Freq: Every day | ORAL | Status: DC

## 2010-09-26 NOTE — Telephone Encounter (Signed)
Simvastatin 80 mg  [employer]

## 2010-10-04 ENCOUNTER — Ambulatory Visit: Payer: Self-pay | Admitting: Cardiology

## 2010-10-04 ENCOUNTER — Telehealth: Payer: Self-pay | Admitting: Cardiology

## 2010-10-04 MED ORDER — SIMVASTATIN 80 MG PO TABS: 80.0000 mg | ORAL_TABLET | Freq: Every day | ORAL | Status: DC

## 2010-10-04 MED ORDER — NITROGLYCERIN 0.4 MG SL SUBL: 0.4000 mg | SUBLINGUAL_TABLET | SUBLINGUAL | Status: DC | PRN

## 2010-10-04 NOTE — Telephone Encounter (Signed)
Per pt call, Pt needs refill called in for zocor 80 mg and nitroglycerin 0.4mg . Pt has been out of zocor for two months. Pt said she has called two times prior about being out of RX. Pt has been out of nitroglycerin for about 4 days.  [facility] in [place] said they faxed refill request 9 times.   Please call refill into [facility] in [place] and call pt to confirm.

## 2010-10-24 ENCOUNTER — Ambulatory Visit: Payer: Self-pay | Admitting: Cardiology

## 2010-12-19 ENCOUNTER — Other Ambulatory Visit: Payer: Self-pay | Admitting: Cardiology

## 2010-12-20 LAB — BASIC METABOLIC PANEL
BUN: 10
BUN: 13
CO2: 25
CO2: 27
Calcium: 8.4
Calcium: 8.7
Chloride: 99
Chloride: 89 — ABNORMAL LOW
Creatinine, Ser: 0.63
Creatinine, Ser: 0.48
GFR calc Af Amer: >60
GFR calc Af Amer: >60
GFR calc non Af Amer: >60
GFR calc non Af Amer: >60
GFR calc non Af Amer: >60
Glucose, Bld: 127 — ABNORMAL HIGH
Glucose, Bld: 83
Glucose, Bld: 92
Potassium: 5.1
Potassium: 5.3 — ABNORMAL HIGH
Potassium: 3.5
Sodium: 130 — ABNORMAL LOW
Sodium: 132 — ABNORMAL LOW

## 2010-12-20 LAB — POCT CARDIAC MARKERS
CKMB, poc: 3
Myoglobin, poc: 26.6
Myoglobin, poc: 54.7
Operator id: 264761
Operator id: 264761
Troponin i, poc: <0.05

## 2010-12-20 LAB — CARDIAC PANEL(CRET KIN+CKTOT+MB+TROPI)
Relative Index: INVALID
Total CK: 16
Troponin I: 0.11 — ABNORMAL HIGH

## 2010-12-20 LAB — CBC
HCT: 31.3 — ABNORMAL LOW
Hemoglobin: 10.3 — ABNORMAL LOW
Hemoglobin: 10.8 — ABNORMAL LOW
MCHC: 33.6
MCHC: 34
MCHC: 34.5
MCV: 93.7
MCV: 94.7
MCV: 93.3
Platelets: 275
Platelets: 288
Platelets: 351
RBC: 3.17 — ABNORMAL LOW
RBC: 3.34 — ABNORMAL LOW
RBC: 3.72 — ABNORMAL LOW
RDW: 14.9
WBC: 9.8
WBC: 8.5

## 2010-12-20 LAB — ETHANOL: Alcohol, Ethyl (B): 164 — ABNORMAL HIGH

## 2010-12-20 LAB — DIFFERENTIAL
Eosinophils Absolute: 0.2
Lymphocytes Relative: 39
Lymphs Abs: 3.3
Monocytes Relative: 9
Neutro Abs: 4.2
Neutrophils Relative %: 49

## 2010-12-20 LAB — PROTIME-INR: INR: 0.9

## 2011-01-06 ENCOUNTER — Other Ambulatory Visit: Payer: Self-pay | Admitting: *Deleted

## 2011-01-06 MED ORDER — ISOSORBIDE MONONITRATE ER 60 MG PO TB24: 60.0000 mg | ORAL_TABLET | Freq: Every day | ORAL | Status: DC

## 2011-02-18 ENCOUNTER — Other Ambulatory Visit: Payer: Self-pay

## 2011-02-18 MED ORDER — LEVOTHYROXINE SODIUM 50 MCG PO TABS: 50.0000 ug | ORAL_TABLET | Freq: Every day | ORAL | Status: DC

## 2011-02-18 NOTE — Telephone Encounter (Signed)
.   Requested Prescriptions   Signed Prescriptions Disp Refills  . levothyroxine (SYNTHROID, LEVOTHROID) 50 MCG tablet 30 tablet 9    Sig: Take 1 tablet (50 mcg total) by mouth daily.    Authorizing Provider: Shawnie Pons    Ordering User: Lacie Scotts

## 2011-02-24 ENCOUNTER — Other Ambulatory Visit: Payer: Self-pay

## 2011-04-29 ENCOUNTER — Other Ambulatory Visit: Payer: Self-pay | Admitting: *Deleted

## 2011-04-29 MED ORDER — CLOPIDOGREL BISULFATE 75 MG PO TABS: 75.0000 mg | ORAL_TABLET | Freq: Every day | ORAL | Status: DC

## 2011-06-03 ENCOUNTER — Other Ambulatory Visit: Payer: Self-pay | Admitting: Cardiology

## 2011-06-04 ENCOUNTER — Other Ambulatory Visit: Payer: Self-pay | Admitting: Cardiology

## 2011-06-04 NOTE — Telephone Encounter (Signed)
PATIENT NEEDS APPT

## 2011-06-05 ENCOUNTER — Telehealth: Payer: Self-pay | Admitting: Cardiology

## 2011-06-05 NOTE — Telephone Encounter (Signed)
plavix 75mg , [facility] in [place]

## 2011-06-09 NOTE — Telephone Encounter (Signed)
Needs to schedule an appt. Before refills can be made

## 2011-07-09 ENCOUNTER — Ambulatory Visit: Payer: Self-pay | Admitting: Cardiology

## 2011-07-15 ENCOUNTER — Ambulatory Visit: Payer: Self-pay | Admitting: Cardiology

## 2011-08-05 ENCOUNTER — Ambulatory Visit: Payer: Self-pay | Admitting: Cardiology

## 2011-08-21 ENCOUNTER — Other Ambulatory Visit: Payer: Self-pay | Admitting: Cardiology

## 2011-09-03 ENCOUNTER — Inpatient Hospital Stay (HOSPITAL_COMMUNITY)
Admission: AD | Admit: 2011-09-03 | Discharge: 2011-09-05 | DRG: 287 | Disposition: A | Discharge status: Home / Self Care | Payer: Medicaid Other | Source: Ambulatory Visit | Attending: Cardiology | Admitting: Cardiology

## 2011-09-03 ENCOUNTER — Ambulatory Visit: Payer: Self-pay | Admitting: Cardiology

## 2011-09-03 ENCOUNTER — Encounter (HOSPITAL_COMMUNITY): Payer: Self-pay | Admitting: *Deleted

## 2011-09-03 DIAGNOSIS — Z79899 Other long term (current) drug therapy: Secondary | ICD-10-CM

## 2011-09-03 DIAGNOSIS — E039 Hypothyroidism, unspecified: Secondary | ICD-10-CM | POA: Diagnosis present

## 2011-09-03 DIAGNOSIS — I509 Heart failure, unspecified: Secondary | ICD-10-CM | POA: Diagnosis present

## 2011-09-03 DIAGNOSIS — I10 Essential (primary) hypertension: Secondary | ICD-10-CM | POA: Diagnosis present

## 2011-09-03 DIAGNOSIS — Z7982 Long term (current) use of aspirin: Secondary | ICD-10-CM

## 2011-09-03 DIAGNOSIS — R079 Chest pain, unspecified: Principal | ICD-10-CM | POA: Diagnosis present

## 2011-09-03 DIAGNOSIS — K219 Gastro-esophageal reflux disease without esophagitis: Secondary | ICD-10-CM | POA: Diagnosis present

## 2011-09-03 DIAGNOSIS — Z87891 Personal history of nicotine dependence: Secondary | ICD-10-CM

## 2011-09-03 DIAGNOSIS — M1990 Unspecified osteoarthritis, unspecified site: Secondary | ICD-10-CM | POA: Diagnosis present

## 2011-09-03 DIAGNOSIS — I359 Nonrheumatic aortic valve disorder, unspecified: Secondary | ICD-10-CM

## 2011-09-03 DIAGNOSIS — I4510 Unspecified right bundle-branch block: Secondary | ICD-10-CM | POA: Diagnosis present

## 2011-09-03 DIAGNOSIS — D649 Anemia, unspecified: Secondary | ICD-10-CM

## 2011-09-03 DIAGNOSIS — I2510 Atherosclerotic heart disease of native coronary artery without angina pectoris: Secondary | ICD-10-CM

## 2011-09-03 DIAGNOSIS — Z6841 Body Mass Index (BMI) 40.0 and over, adult: Secondary | ICD-10-CM

## 2011-09-03 HISTORY — DX: Gastro-esophageal reflux disease without esophagitis: K21.9

## 2011-09-03 LAB — DIFFERENTIAL
Basophils Absolute: 0.1 10*3/uL (ref 0.0–0.1)
Eosinophils Relative: 1 % (ref 0–5)
Lymphocytes Relative: 37 % (ref 12–46)
Neutro Abs: 4 10*3/uL (ref 1.7–7.7)

## 2011-09-03 LAB — HEPARIN LEVEL (UNFRACTIONATED)
Heparin Unfractionated: 0.75 IU/mL — ABNORMAL HIGH (ref 0.30–0.70)
Heparin Unfractionated: 0.77 IU/mL — ABNORMAL HIGH (ref 0.30–0.70)

## 2011-09-03 LAB — COMPREHENSIVE METABOLIC PANEL
AST: 20 U/L (ref 0–37)
BUN: 8 mg/dL (ref 6–23)
CO2: 19 mEq/L (ref 19–32)
Calcium: 9.4 mg/dL (ref 8.4–10.5)
Chloride: 95 mEq/L — ABNORMAL LOW (ref 96–112)
Creatinine, Ser: 0.68 mg/dL (ref 0.50–1.10)
GFR calc Af Amer: >90 mL/min (ref >90)
GFR calc non Af Amer: >90 mL/min (ref >90)
Total Bilirubin: 0.3 mg/dL (ref 0.3–1.2)

## 2011-09-03 LAB — CARDIAC PANEL(CRET KIN+CKTOT+MB+TROPI)
Relative Index: INVALID (ref 0.0–2.5)
Relative Index: INVALID (ref 0.0–2.5)
Total CK: 22 U/L (ref 7–177)
Troponin I: <0.3 ng/mL (ref <0.30)
Troponin I: <0.3 ng/mL (ref <0.30)

## 2011-09-03 LAB — APTT: aPTT: 113 seconds — ABNORMAL HIGH (ref 24–37)

## 2011-09-03 LAB — CBC
MCHC: 34 g/dL (ref 30.0–36.0)
Platelets: 301 10*3/uL (ref 150–400)
RDW: 14.1 % (ref 11.5–15.5)
WBC: 7.3 10*3/uL (ref 4.0–10.5)

## 2011-09-03 LAB — MRSA PCR SCREENING: MRSA by PCR: NEGATIVE

## 2011-09-03 LAB — MAGNESIUM: Magnesium: 1.6 mg/dL (ref 1.5–2.5)

## 2011-09-03 LAB — TSH: TSH: 0.732 u[IU]/mL (ref 0.350–4.500)

## 2011-09-03 LAB — PROTIME-INR
INR: 1.14 (ref 0.00–1.49)
Prothrombin Time: 14.8 seconds (ref 11.6–15.2)

## 2011-09-03 LAB — FERRITIN: Ferritin: 235 ng/mL (ref 10–291)

## 2011-09-03 MED ORDER — ONDANSETRON HCL 4 MG/2ML IJ SOLN
4.0000 mg | Freq: Four times a day (QID) | INTRAMUSCULAR | Status: DC | PRN
  Administered 2011-09-03 – 2011-09-04 (×5): 4 mg via INTRAVENOUS
  Filled 2011-09-03 (×4): qty 2

## 2011-09-03 MED ORDER — SODIUM CHLORIDE 0.9 % IV SOLN
250.0000 mL | INTRAVENOUS | Status: DC | PRN
  Administered 2011-09-03: 10 mL/h via INTRAVENOUS

## 2011-09-03 MED ORDER — ASPIRIN EC 81 MG PO TBEC: 81.0000 mg | DELAYED_RELEASE_TABLET | Freq: Every day | ORAL | Status: DC

## 2011-09-03 MED ORDER — HEPARIN (PORCINE) IN NACL 100-0.45 UNIT/ML-% IJ SOLN
900.0000 [IU]/h | INTRAMUSCULAR | Status: DC
  Administered 2011-09-03: 1000 [IU]/h via INTRAVENOUS
  Filled 2011-09-03 (×3): qty 250

## 2011-09-03 MED ORDER — NITROGLYCERIN IN D5W 200-5 MCG/ML-% IV SOLN
3.0000 ug/min | INTRAVENOUS | Status: DC
  Administered 2011-09-03: 20 ug/min via INTRAVENOUS
  Administered 2011-09-03: 40 ug/min via INTRAVENOUS
  Filled 2011-09-03: qty 250

## 2011-09-03 MED ORDER — MORPHINE SULFATE 2 MG/ML IJ SOLN
2.0000 mg | INTRAMUSCULAR | Status: DC | PRN
  Administered 2011-09-03 – 2011-09-05 (×9): 2 mg via INTRAVENOUS
  Filled 2011-09-03 (×9): qty 1

## 2011-09-03 MED ORDER — CLOPIDOGREL BISULFATE 75 MG PO TABS
75.0000 mg | ORAL_TABLET | Freq: Every day | ORAL | Status: DC
  Administered 2011-09-03 – 2011-09-05 (×2): 75 mg via ORAL
  Filled 2011-09-03 (×4): qty 1

## 2011-09-03 MED ORDER — SODIUM CHLORIDE 0.9 % IJ SOLN: 3.0000 mL | INTRAMUSCULAR | Status: DC | PRN

## 2011-09-03 MED ORDER — NITROGLYCERIN 0.4 MG SL SUBL: 0.4000 mg | SUBLINGUAL_TABLET | SUBLINGUAL | Status: DC | PRN

## 2011-09-03 MED ORDER — ATORVASTATIN CALCIUM 40 MG PO TABS
40.0000 mg | ORAL_TABLET | Freq: Every day | ORAL | Status: DC
  Administered 2011-09-03 – 2011-09-04 (×2): 40 mg via ORAL
  Filled 2011-09-03 (×3): qty 1

## 2011-09-03 MED ORDER — GABAPENTIN 300 MG PO CAPS
300.0000 mg | ORAL_CAPSULE | Freq: Three times a day (TID) | ORAL | Status: DC
  Administered 2011-09-03 – 2011-09-05 (×7): 300 mg via ORAL
  Filled 2011-09-03 (×10): qty 1

## 2011-09-03 MED ORDER — METHOCARBAMOL 750 MG PO TABS
750.0000 mg | ORAL_TABLET | Freq: Three times a day (TID) | ORAL | Status: DC
  Administered 2011-09-03 – 2011-09-05 (×7): 750 mg via ORAL
  Filled 2011-09-03 (×10): qty 1

## 2011-09-03 MED ORDER — SODIUM CHLORIDE 0.9 % IV SOLN: 250.0000 mL | INTRAVENOUS | Status: DC | PRN

## 2011-09-03 MED ORDER — METOPROLOL TARTRATE 25 MG PO TABS
25.0000 mg | ORAL_TABLET | Freq: Two times a day (BID) | ORAL | Status: DC
  Administered 2011-09-03 – 2011-09-04 (×3): 25 mg via ORAL
  Filled 2011-09-03 (×5): qty 1

## 2011-09-03 MED ORDER — ACETAMINOPHEN 325 MG PO TABS
650.0000 mg | ORAL_TABLET | ORAL | Status: DC | PRN
  Administered 2011-09-03 – 2011-09-04 (×5): 650 mg via ORAL
  Filled 2011-09-03 (×4): qty 2

## 2011-09-03 MED ORDER — ASPIRIN 81 MG PO CHEW
81.0000 mg | CHEWABLE_TABLET | Freq: Every day | ORAL | Status: DC
  Administered 2011-09-03 – 2011-09-05 (×3): 81 mg via ORAL
  Filled 2011-09-03 (×4): qty 1

## 2011-09-03 MED ORDER — DOCUSATE SODIUM 100 MG PO CAPS
100.0000 mg | ORAL_CAPSULE | Freq: Two times a day (BID) | ORAL | Status: DC
  Administered 2011-09-03 – 2011-09-05 (×4): 100 mg via ORAL
  Filled 2011-09-03 (×7): qty 1

## 2011-09-03 MED ORDER — SODIUM CHLORIDE 0.9 % IJ SOLN
3.0000 mL | Freq: Two times a day (BID) | INTRAMUSCULAR | Status: DC
  Administered 2011-09-03: 3 mL via INTRAVENOUS

## 2011-09-03 MED ORDER — HYDROCODONE-ACETAMINOPHEN 5-325 MG PO TABS
1.0000 | ORAL_TABLET | Freq: Four times a day (QID) | ORAL | Status: DC | PRN
  Administered 2011-09-03 – 2011-09-05 (×6): 1 via ORAL
  Filled 2011-09-03 (×6): qty 1

## 2011-09-03 MED ORDER — ZOLPIDEM TARTRATE 5 MG PO TABS
10.0000 mg | ORAL_TABLET | Freq: Every evening | ORAL | Status: DC | PRN
  Administered 2011-09-03 – 2011-09-04 (×2): 10 mg via ORAL
  Filled 2011-09-03: qty 2
  Filled 2011-09-03 (×2): qty 1

## 2011-09-03 MED ORDER — LEVOTHYROXINE SODIUM 50 MCG PO TABS
50.0000 ug | ORAL_TABLET | Freq: Every day | ORAL | Status: DC
  Administered 2011-09-03 – 2011-09-05 (×2): 50 ug via ORAL
  Filled 2011-09-03 (×4): qty 1

## 2011-09-03 MED ORDER — SODIUM CHLORIDE 0.9 % IV SOLN
1.0000 mL/kg/h | INTRAVENOUS | Status: DC
  Administered 2011-09-04: 1 mL/kg/h via INTRAVENOUS

## 2011-09-03 MED ORDER — ALPRAZOLAM 0.25 MG PO TABS
0.2500 mg | ORAL_TABLET | Freq: Two times a day (BID) | ORAL | Status: DC | PRN
  Administered 2011-09-03: 0.25 mg via ORAL
  Filled 2011-09-03: qty 1

## 2011-09-03 MED ORDER — ASPIRIN 325 MG PO TABS
325.0000 mg | ORAL_TABLET | Freq: Every day | ORAL | Status: DC
  Filled 2011-09-03: qty 1

## 2011-09-03 MED ORDER — SODIUM CHLORIDE 0.9 % IJ SOLN
3.0000 mL | Freq: Two times a day (BID) | INTRAMUSCULAR | Status: DC
  Administered 2011-09-03 (×2): 3 mL via INTRAVENOUS

## 2011-09-03 MED ORDER — BIOTENE DRY MOUTH MT LIQD
15.0000 mL | Freq: Two times a day (BID) | OROMUCOSAL | Status: DC
  Administered 2011-09-03 – 2011-09-05 (×3): 15 mL via OROMUCOSAL

## 2011-09-03 MED FILL — Heparin Sodium (Porcine) 100 Unt/ML in Sodium Chloride 0.45%: INTRAMUSCULAR | Qty: 250 | Status: AC

## 2011-09-03 MED FILL — Nitroglycerin IV Soln 200 MCG/ML in D5W: INTRAVENOUS | Qty: 250 | Status: AC

## 2011-09-03 NOTE — Progress Notes (Signed)
ANTICOAGULATION CONSULT NOTE - Follow-Up  Pharmacy Consult for heparin Indication: chest pain/ACS  No Known Allergies  Patient Measurements: Height: 5' (152.4 cm) Weight: 211 lb 13.8 oz (96.1 kg) IBW/kg (Calculated) : 45.5   Vital Signs: Temp: 97.6 F (36.4 C) (06/12 1639) Temp src: Oral (06/12 1639) BP: 144/75 mmHg (06/12 1639) Pulse Rate: 75  (06/12 1639)  Estimated Creatinine Clearance: 78.5 ml/min (by C-G formula based on Cr of 0.68).   Medical History: Past Medical History  Diagnosis Date  . Cardiac dysrhythmia, unspecified   . Anemia, unspecified   . Osteoarthrosis, unspecified whether generalized or localized, unspecified site   . Headache   . Morbid obesity   . Backache, unspecified   . Unspecified essential hypertension   . Unspecified hypothyroidism   . Coronary atherosclerosis of unspecified type of vessel, native or graft   . Gastric ulcer   . DDD (degenerative disc disease)   . GERD (gastroesophageal reflux disease)   . CHF (congestive heart failure)     Medications:  Scheduled:     . antiseptic oral rinse  15 mL Mouth Rinse BID  . aspirin  81 mg Oral Daily  . atorvastatin  40 mg Oral q1800  . clopidogrel  75 mg Oral Q breakfast  . docusate sodium  100 mg Oral BID  . gabapentin  300 mg Oral TID  . levothyroxine  50 mcg Oral QAC breakfast  . methocarbamol  750 mg Oral TID  . metoprolol  25 mg Oral BID  . sodium chloride  3 mL Intravenous Q12H  . sodium chloride  3 mL Intravenous Q12H  . DISCONTD: aspirin EC  81 mg Oral Daily  . DISCONTD: aspirin  325 mg Oral Daily    Assessment: 61yo female with h/o multiple cardiac caths/PCIs, supposed to be on Plavix but ran out and missed OV to have refilled, c/o intermittent CP x3d resolved with NTG x2 then began persistent CP radiating to back and left arm. Heparin level is just above upper end of goal range at 0.77 units/ml.  She is without noted bleeding with therapy.  Goal of Therapy:  Heparin level  0.3-0.7 units/ml Monitor platelets by anticoagulation protocol: Yes   Plan:  Will decrease heparin slightly to 900 units/hr Recheck AM heparin level and CBC  Nadara Mustard, PharmD., MS Clinical Pharmacist Pager:  [phone]  Thank you for allowing pharmacy to be part of this patients care team. 09/03/2011,6:08 PM

## 2011-09-03 NOTE — Progress Notes (Signed)
Utilization Review Completed.  [name]  09/03/2011  

## 2011-09-03 NOTE — Progress Notes (Signed)
ANTICOAGULATION CONSULT NOTE - Initial Consult  Pharmacy Consult for heparin Indication: chest pain/ACS  No Known Allergies  Patient Measurements: Height: 5' (152.4 cm) Weight: 211 lb 13.8 oz (96.1 kg) IBW/kg (Calculated) : 45.5   Vital Signs: Temp: 98.2 F (36.8 C) (06/12 0814) Temp src: Oral (06/12 0814) BP: 130/87 mmHg (06/12 0814) Pulse Rate: 90  (06/12 0826)  Estimated Creatinine Clearance: 78.5 ml/min (by C-G formula based on Cr of 0.68).   Medical History: Past Medical History  Diagnosis Date  . Cardiac dysrhythmia, unspecified   . Anemia, unspecified   . Osteoarthrosis, unspecified whether generalized or localized, unspecified site   . Headache   . Morbid obesity   . Backache, unspecified   . Unspecified essential hypertension   . Unspecified hypothyroidism   . Coronary atherosclerosis of unspecified type of vessel, native or graft   . Gastric ulcer   . DDD (degenerative disc disease)   . GERD (gastroesophageal reflux disease)   . CHF (congestive heart failure)     Medications:  Scheduled:     . antiseptic oral rinse  15 mL Mouth Rinse BID  . aspirin  325 mg Oral Daily  . atorvastatin  40 mg Oral q1800  . clopidogrel  75 mg Oral Q breakfast  . docusate sodium  100 mg Oral BID  . gabapentin  300 mg Oral TID  . levothyroxine  50 mcg Oral QAC breakfast  . metoprolol  25 mg Oral BID  . sodium chloride  3 mL Intravenous Q12H  . DISCONTD: aspirin EC  81 mg Oral Daily    Assessment: 61yo female with h/o multiple cardiac caths/PCIs, supposed to be on Plavix but ran out and missed OV to have refilled, c/o intermittent CP x3d resolved with NTG x2 then began persistent CP radiating to back and left arm. Heparin is therapeutic this AM. Plan for cath in AM  Goal of Therapy:  Heparin level 0.3-0.7 units/ml Monitor platelets by anticoagulation protocol: Yes   Plan:  Cont heparin at 1000 units/hr Recheck 6hr heparin level  Clide Cliff PharmD  BCPS 09/03/2011,9:13 AM

## 2011-09-03 NOTE — H&P (Addendum)
Admit date: 09/03/2011 Referring Physician [facility]  Primary Cardiologist  Dr. Riley Kill Chief complaint/reason for admission: Chest pain  HPI: This is a 61yo WF with an extensive cardiac history of CAD s/p multiple caths and PCIs.  She had been on Plavix but ran out and missed her appt to get her Plavix refilled.  She was supposed to have an OV today to get her refill.  She says that she has been having intermittent chest pain for about 3 days which would resolve after NTG x2.  Yesterday around 5PM she started having chest pain which has been constant and presented to [facility].  The pain is a sharp stabbing pain in her back and chest and left arm with left arm numbness.  She also has left jaw pain.  She currently complains of 7/10 pain.  She had some SOB but currently she has no SOB.  She has been nauseated as well.   She was placed on IV NTG but complains now of a headache.  Initial cardiac enzymes at [facility] show CPK 41 with MB 2.1 and troponin 0.04    PMH:    Past Medical History  Diagnosis Date  . Cardiac dysrhythmia, unspecified   . Anemia, unspecified   . Osteoarthrosis, unspecified whether generalized or localized, unspecified site   . Headache   . Morbid obesity   . Backache, unspecified   . Unspecified essential hypertension   . Unspecified hypothyroidism   . Coronary atherosclerosis of unspecified type of vessel, native or graft   . Gastric ulcer   . DDD (degenerative disc disease)   . GERD (gastroesophageal reflux disease)   . CHF (congestive heart failure)     PSH:    Past Surgical History  Procedure Date  . Cesarean section   . Coronary angioplasty with stent placement     mid lad (promus drug eluting stent) 2009. cutting balloon angioplasty first obtuse marginal. three overlapping stents in the RCA- promus DES x3  . Bilateral shoulder rcr   . Abdominal hysterectomy   . Joint replacement     ALLERGIES:   Review of patient's allergies indicates not on  file.  Prior to Admit Meds:   Prescriptions prior to admission  Medication Sig Dispense Refill  . aspirin 325 MG tablet Take 325 mg by mouth daily.        Marland Kitchen docusate sodium (COLACE) 100 MG capsule Take 100 mg by mouth 2 (two) times daily.        Marland Kitchen gabapentin (NEURONTIN) 300 MG capsule Take 300 mg by mouth 3 (three) times daily.       Marland Kitchen HYDROcodone-acetaminophen (LORTAB) 7.5-500 MG per tablet Take 1 tablet by mouth every 8 (eight) hours as needed.      . isosorbide mononitrate (IMDUR) 60 MG 24 hr tablet Take 1 tablet (60 mg total) by mouth daily.  30 tablet  6  . levothyroxine (SYNTHROID, LEVOTHROID) 50 MCG tablet Take 1 tablet (50 mcg total) by mouth daily.  30 tablet  9  . metoprolol (LOPRESSOR) 50 MG tablet TAKE ONE-HALF TABLET TWICE A DAY  30 tablet  3  . nitroGLYCERIN (NITROSTAT) 0.4 MG SL tablet Place 1 tablet (0.4 mg total) under the tongue every 5 (five) minutes as needed.  100 tablet  2  . simvastatin (ZOCOR) 80 MG tablet Take 1 tablet (80 mg total) by mouth at bedtime.  90 tablet  2  . clopidogrel (PLAVIX) 75 MG tablet Take 1 tablet (75 mg total) by mouth  daily.  30 tablet  0  . Multiple Vitamin (MULTIVITAMIN) tablet Take 1 tablet by mouth daily.         Family HX:    Family History  Problem Relation Age of Onset  . Heart attack Other     female <55  . Cancer Other   . Diabetes Other   . Arthritis Other    Social HX:    History   Social History  . Marital Status: Married    Spouse Name: N/A    Number of Children: N/A  . Years of Education: N/A   Occupational History  . Not on file.   Social History Main Topics  . Smoking status: Former Games developer  . Smokeless tobacco: Not on file  . Alcohol Use: No  . Drug Use: No  . Sexually Active: Not on file   Other Topics Concern  . Not on file   Social History Narrative   Married, does not get regular exercise. Pt works as an Charity fundraiser at [employer].       ROS:  All 11 ROS were addressed and are negative except what is stated in  the HPI  PHYSICAL EXAM Filed Vitals:   09/03/11 0301  BP: 145/94  Pulse: 94  Temp: 97.7 F (36.5 C)  Resp: 13   General: Well developed, well nourished, in no acute distress Head: Eyes PERRLA, No xanthomas.   Normal cephalic and atramatic  Lungs:   Clear bilaterally to auscultation and percussion. Heart:   HRRR S1 S2 Pulses are 2+ & equal.            No carotid bruit. No JVD.  No abdominal bruits. No femoral bruits. Abdomen: Bowel sounds are positive, abdomen soft and non-tender without masses  Extremities:   No clubbing, cyanosis or edema.  DP +1 Neuro: Alert and oriented X 3. Psych:  Good affect, responds appropriately   Labs:   Lab Results  Component Value Date   WBC 6.4 11/25/2008   HGB 10.3* 11/25/2008   HCT 30.5* 11/25/2008   MCV 91.1 11/25/2008   PLT 190 11/25/2008   No results found for this basename: NA,K,CL,CO2,BUN,CREATININE,CALCIUM,LABALBU,PROT,BILITOT,ALKPHOS,ALT,AST,GLUCOSE in the last 168 hours Lab Results  Component Value Date   CKTOTAL 24 11/15/2008   CKMB 1.4 11/15/2008   TROPONINI  Value: 0.05        NO INDICATION OF MYOCARDIAL INJURY. 11/15/2008   No results found for this basename: PTT   Lab Results  Component Value Date   INR 1.0 11/16/2008   INR 0.9 HEMOLYZED SPECIMEN, RESULTS MAY BE AFFECTED 10/21/2007     Lab Results  Component Value Date   CHOL  Value: 135        ATP III CLASSIFICATION:  <200     mg/dL   Desirable  409-811  mg/dL   Borderline High  >=914    mg/dL   High        09/29/2954   Lab Results  Component Value Date   HDL 72 10/23/2008   Lab Results  Component Value Date   LDLCALC  Value: 50        Total Cholesterol/HDL:CHD Risk Coronary Heart Disease Risk Table                     Men   Women  1/2 Average Risk   3.4   3.3  Average Risk       5.0   4.4  2 X Average Risk  9.6   7.1  3 X Average Risk  23.4   11.0        Use the calculated Patient Ratio above and the CHD Risk Table to determine the patient's CHD Risk.        ATP III CLASSIFICATION  (LDL):  <100     mg/dL   Optimal  161-096  mg/dL   Near or Above                    Optimal  130-159  mg/dL   Borderline  045-409  mg/dL   High  >811     mg/dL   Very High 11/22/4780   Lab Results  Component Value Date   TRIG 65 10/23/2008   Lab Results  Component Value Date   CHOLHDL 1.9 10/23/2008   No results found for this basename: LDLDIRECT      Radiology: pending  EKG:  NSR, RBBB  ASSESSMENT:  1.  Crescendo angina with normal CPK and MB and slightly elevated Troponin ( cut off at [facility] is 0.03).  She continues to have chest pain.  EKG show chronic RBBB.  Cath in 2010 showed patent stents  In LAD/RCA and left circumflex showed patent stents and chest pain was felt to be noncardiac. 2.  CAD with multiple PCIs in the past. 3.  HTN 4.  GERD  PLAN:   1.  Cycle cardiac enzymes 2.  Continue IV Heparin and NTG gtt 3.  ASA 4.  NPO 5.  Cath in am per [facility] Cards  Quintella Reichert, MD  09/03/2011  3:37 AM

## 2011-09-03 NOTE — Progress Notes (Signed)
ANTICOAGULATION CONSULT NOTE - Initial Consult  Pharmacy Consult for heparin Indication: chest pain/ACS  Not on File  Patient Measurements: Height: 5' (152.4 cm) Weight: 211 lb 13.8 oz (96.1 kg) IBW/kg (Calculated) : 45.5   Vital Signs: Temp: 97.7 F (36.5 C) (06/12 0301) Temp src: Oral (06/12 0301) BP: 145/94 mmHg (06/12 0301) Pulse Rate: 94  (06/12 0301)  Estimated Creatinine Clearance: 63.5 ml/min (by C-G formula based on Cr of 0.99).   Medical History: Past Medical History  Diagnosis Date  . Cardiac dysrhythmia, unspecified   . Anemia, unspecified   . Osteoarthrosis, unspecified whether generalized or localized, unspecified site   . Headache   . Morbid obesity   . Backache, unspecified   . Unspecified essential hypertension   . Unspecified hypothyroidism   . Coronary atherosclerosis of unspecified type of vessel, native or graft   . Gastric ulcer   . DDD (degenerative disc disease)   . GERD (gastroesophageal reflux disease)   . CHF (congestive heart failure)     Medications:  Scheduled:    . aspirin  325 mg Oral Daily  . atorvastatin  40 mg Oral q1800  . clopidogrel  75 mg Oral Q breakfast  . docusate sodium  100 mg Oral BID  . gabapentin  300 mg Oral TID  . levothyroxine  50 mcg Oral QAC breakfast  . metoprolol  25 mg Oral BID  . sodium chloride  3 mL Intravenous Q12H  . DISCONTD: aspirin EC  81 mg Oral Daily    Assessment: 61yo female with h/o multiple cardiac caths/PCIs, supposed to be on Plavix but ran out and missed OV to have refilled, c/o intermittent CP x3d resolved with NTG x2 then began persistent CP radiating to back and left arm, to begin heparin for angina.  Goal of Therapy:  Heparin level 0.3-0.7 units/ml Monitor platelets by anticoagulation protocol: Yes   Plan:  Rec'd heparin 4000 units IV bolus x1 followed by gtt at 1000 units/hr at [facility]; will continue at current rate and monitor heparin levels and CBC.  Colleen Can  PharmD BCPS 09/03/2011,4:10 AM

## 2011-09-03 NOTE — Progress Notes (Signed)
  Echocardiogram 2D Echocardiogram has been performed.  Cathie Beams Deneen 09/03/2011, 3:14 PM

## 2011-09-03 NOTE — Progress Notes (Signed)
Subjective:  Currently stable.  Chest pain has been more frequent over the past three weeks.    Objective:  Vital Signs in the last 24 hours: Temp:  [97.7 F (36.5 C)-98.3 F (36.8 C)] 98.2 F (36.8 C) (06/12 0814) Pulse Rate:  [90-113] 90  (06/12 0826) Resp:  [12-15] 12  (06/12 0826) BP: (130-163)/(73-94) 130/87 mmHg (06/12 0814) SpO2:  [99 %-100 %] 100 % (06/12 0826) Weight:  [211 lb 13.8 oz (96.1 kg)] 211 lb 13.8 oz (96.1 kg) (06/12 0301)  Intake/Output from previous day: 06/11 0701 - 06/12 0700 In: 114.2 [I.V.:114.2] Out: 250 [Urine:250]   Physical Exam: General: Well developed, well nourished, in no acute distress. Head:  Normocephalic and atraumatic. Lungs: Clear to auscultation and percussion. Heart: Normal S1 and S2.  No murmur, rubs or gallops.  Pulses: Pulses normal in all 4 extremities. Extremities: No clubbing or cyanosis. No edema. Neurologic: Alert and oriented x 3.    Lab Results: No results found for this basename: WBC:2,HGB:2,PLT:2 in the last 72 hours  Basename 09/03/11 0400  NA 132*  K 3.7  CL 95*  CO2 19  GLUCOSE 73  BUN 8  CREATININE 0.68    Basename 09/03/11 0400  TROPONINI <0.30   Hepatic Function Panel  Basename 09/03/11 0400  PROT 6.8  ALBUMIN 4.1  AST 20  ALT 14  ALKPHOS 55  BILITOT 0.3  BILIDIR --  IBILI --   No results found for this basename: CHOL in the last 72 hours No results found for this basename: PROTIME in the last 72 hours  Imaging: No results found.  EKG:  Data reviewed  Cardiac Studies:  Enzymes neg so far.    Assessment/Plan:  1.  CAD/chest pain      Symptoms are worrisome but not classic for recurrent ischemia.  I do think, based on her multivessel stenting, that recath is indicated as she has risks associated with all of these stents, and her potential for recurrent problems are high.   Case discussed with patient and she is agreeable.  Will schedule for am tomorrow.        Shawnie Pons, MD,  [facility], FSCAI 09/03/2011, 8:39 AM

## 2011-09-04 ENCOUNTER — Encounter (HOSPITAL_COMMUNITY)
Admission: AD | Disposition: A | Discharge status: Home / Self Care | Payer: Self-pay | Source: Ambulatory Visit | Attending: Cardiology

## 2011-09-04 DIAGNOSIS — R079 Chest pain, unspecified: Secondary | ICD-10-CM

## 2011-09-04 HISTORY — PX: LEFT HEART CATHETERIZATION WITH CORONARY ANGIOGRAM: SHX5451

## 2011-09-04 LAB — CBC
HCT: 37.5 % (ref 36.0–46.0)
MCH: 28.7 pg (ref 26.0–34.0)
MCV: 86.2 fL (ref 78.0–100.0)
Platelets: 292 10*3/uL (ref 150–400)
RBC: 4.35 MIL/uL (ref 3.87–5.11)
WBC: 7.3 10*3/uL (ref 4.0–10.5)

## 2011-09-04 LAB — BASIC METABOLIC PANEL
CO2: 21 mEq/L (ref 19–32)
Calcium: 9.3 mg/dL (ref 8.4–10.5)
Creatinine, Ser: 0.67 mg/dL (ref 0.50–1.10)
Glucose, Bld: 84 mg/dL (ref 70–99)
Potassium: 3.8 mEq/L (ref 3.5–5.1)

## 2011-09-04 LAB — POCT ACTIVATED CLOTTING TIME: Activated Clotting Time: 122 seconds

## 2011-09-04 SURGERY — LEFT HEART CATHETERIZATION WITH CORONARY ANGIOGRAM
Anesthesia: LOCAL

## 2011-09-04 MED ORDER — FENTANYL CITRATE 0.05 MG/ML IJ SOLN
INTRAMUSCULAR | Status: AC
  Filled 2011-09-04: qty 2

## 2011-09-04 MED ORDER — MIDAZOLAM HCL 2 MG/2ML IJ SOLN
INTRAMUSCULAR | Status: AC
  Filled 2011-09-04: qty 2

## 2011-09-04 MED ORDER — NITROGLYCERIN 0.2 MG/ML ON CALL CATH LAB
INTRAVENOUS | Status: AC
  Filled 2011-09-04: qty 1

## 2011-09-04 MED ORDER — LIDOCAINE HCL (PF) 1 % IJ SOLN
INTRAMUSCULAR | Status: AC
  Filled 2011-09-04: qty 30

## 2011-09-04 MED ORDER — SODIUM CHLORIDE 0.9 % IV SOLN: INTRAVENOUS | Status: DC

## 2011-09-04 MED ORDER — METOPROLOL TARTRATE 50 MG PO TABS
50.0000 mg | ORAL_TABLET | Freq: Two times a day (BID) | ORAL | Status: DC
  Administered 2011-09-04 – 2011-09-05 (×2): 50 mg via ORAL
  Filled 2011-09-04 (×3): qty 1

## 2011-09-04 MED ORDER — ALPRAZOLAM 0.5 MG PO TABS
0.5000 mg | ORAL_TABLET | Freq: Two times a day (BID) | ORAL | Status: DC
  Administered 2011-09-04 – 2011-09-05 (×3): 0.5 mg via ORAL
  Filled 2011-09-04 (×3): qty 1

## 2011-09-04 MED ORDER — ONDANSETRON HCL 4 MG/2ML IJ SOLN
INTRAMUSCULAR | Status: AC
  Filled 2011-09-04: qty 2

## 2011-09-04 MED ORDER — HYDRALAZINE HCL 20 MG/ML IJ SOLN
INTRAMUSCULAR | Status: AC
  Filled 2011-09-04: qty 1

## 2011-09-04 MED ORDER — HYDRALAZINE HCL 20 MG/ML IJ SOLN
10.0000 mg | Freq: Once | INTRAMUSCULAR | Status: AC
  Administered 2011-09-04: 10 mg via INTRAVENOUS

## 2011-09-04 MED ORDER — HEPARIN (PORCINE) IN NACL 2-0.9 UNIT/ML-% IJ SOLN
INTRAMUSCULAR | Status: AC
  Filled 2011-09-04: qty 2000

## 2011-09-04 MED ORDER — LISINOPRIL 5 MG PO TABS
5.0000 mg | ORAL_TABLET | Freq: Every day | ORAL | Status: DC
  Administered 2011-09-04 – 2011-09-05 (×2): 5 mg via ORAL
  Filled 2011-09-04 (×2): qty 1

## 2011-09-04 MED ORDER — HYDROCODONE-ACETAMINOPHEN 5-325 MG PO TABS
1.0000 | ORAL_TABLET | Freq: Four times a day (QID) | ORAL | Status: DC
  Administered 2011-09-04 – 2011-09-05 (×4): 1 via ORAL
  Filled 2011-09-04 (×4): qty 1

## 2011-09-04 MED ORDER — ACETAMINOPHEN 325 MG PO TABS
ORAL_TABLET | ORAL | Status: AC
  Filled 2011-09-04: qty 2

## 2011-09-04 NOTE — Progress Notes (Signed)
Heparin D/C on call from cath lab at this time.  Timmie Foerster, RN

## 2011-09-04 NOTE — CV Procedure (Signed)
   Cardiac Catheterization Operative Report  [name] [id number] 6/13/20138:09 AM No primary provider on file.  Procedure Performed:  1. Left Heart Catheterization 2. Selective Coronary Angiography 3. Left ventricular angiogram  Operator: Verne Carrow, MD  Indication: Known CAD, pt of Dr. Riley Kill. He saw her yesterday am after she was admitted with chest pain. Negative cardiac enzymes.                                       Procedure Details: The risks, benefits, complications, treatment options, and expected outcomes were discussed with the patient. The patient and/or family concurred with the proposed plan, giving informed consent. The patient was brought to the cath lab after IV hydration was begun and oral premedication was given. The patient was further sedated with Versed and Fentanyl. Allens test was negative on the right wrist. The right groin was prepped and draped in the usual manner. Using the modified Seldinger access technique, a 5 French sheath was placed in the right femoral artery. Standard diagnostic catheters were used to perform selective coronary angiography. A pigtail catheter was used to perform a left ventricular angiogram.   There were no immediate complications. The patient was taken to the recovery area in stable condition.   Hemodynamic Findings: Central aortic pressure: 158/79 Left ventricular pressure: 163/7/21  Angiographic Findings:  Left main:  No obstructive disease.   Left Anterior Descending Artery: Large caliber vessel that courses to the apex. Patent stents mid LAD. There is mild plaque in the mid LAD and distal LAD. No obstructive lesions.   Circumflex Artery: Large caliber vessel. Patent stent mid Circumflex. OM branch is patent.   Right Coronary Artery:  Large dominant vessel with patent proximal and mid stents. Heavily calcified vessel.   Left Ventricular Angiogram: LVEF 55%.   Impression: 1. Triple vessel CAD with  patent stents in the mid LAD, mid Circumflex and prox/mid RCA 2. Preserved LV systolic function   Recommendations: Continued medical management per Dr. Riley Kill.        Complications:  None. The patient tolerated the procedure well.

## 2011-09-04 NOTE — Progress Notes (Signed)
CARDIAC REHAB PHASE I   PRE:  Rate/Rhythm: 72 SR  BP:  Supine: 125/72  Sitting:   Standing:    SaO2:   MODE:  Ambulation:  ft   POST:  Rate/Rhythem:   BP:  Supine:   Sitting:  Standing:   SaO2:  1405-1435  Cardiac Rehab On arrival pt in bed, pt states that she is unable to walk. States that she fell in May and has not been able to walk but a few steps to recliner, BSC or wheelchair. She said that she did not come to the hospital, but her PCP told her nothing was broken that she pulled some muscles. On further discussion she admits that her ability to walk has been going down the past two years.Discussed proper use of sl NTG.  [name]

## 2011-09-04 NOTE — H&P (View-Only) (Signed)
Subjective:  Currently stable.  Chest pain has been more frequent over the past three weeks.    Objective:  Vital Signs in the last 24 hours: Temp:  [97.7 F (36.5 C)-98.3 F (36.8 C)] 98.2 F (36.8 C) (06/12 0814) Pulse Rate:  [90-113] 90  (06/12 0826) Resp:  [12-15] 12  (06/12 0826) BP: (130-163)/(73-94) 130/87 mmHg (06/12 0814) SpO2:  [99 %-100 %] 100 % (06/12 0826) Weight:  [211 lb 13.8 oz (96.1 kg)] 211 lb 13.8 oz (96.1 kg) (06/12 0301)  Intake/Output from previous day: 06/11 0701 - 06/12 0700 In: 114.2 [I.V.:114.2] Out: 250 [Urine:250]   Physical Exam: General: Well developed, well nourished, in no acute distress. Head:  Normocephalic and atraumatic. Lungs: Clear to auscultation and percussion. Heart: Normal S1 and S2.  No murmur, rubs or gallops.  Pulses: Pulses normal in all 4 extremities. Extremities: No clubbing or cyanosis. No edema. Neurologic: Alert and oriented x 3.    Lab Results: No results found for this basename: WBC:2,HGB:2,PLT:2 in the last 72 hours  Basename 09/03/11 0400  NA 132*  K 3.7  CL 95*  CO2 19  GLUCOSE 73  BUN 8  CREATININE 0.68    Basename 09/03/11 0400  TROPONINI <0.30   Hepatic Function Panel  Basename 09/03/11 0400  PROT 6.8  ALBUMIN 4.1  AST 20  ALT 14  ALKPHOS 55  BILITOT 0.3  BILIDIR --  IBILI --   No results found for this basename: CHOL in the last 72 hours No results found for this basename: PROTIME in the last 72 hours  Imaging: No results found.  EKG:  Data reviewed  Cardiac Studies:  Enzymes neg so far.    Assessment/Plan:  1.  CAD/chest pain      Symptoms are worrisome but not classic for recurrent ischemia.  I do think, based on her multivessel stenting, that recath is indicated as she has risks associated with all of these stents, and her potential for recurrent problems are high.   Case discussed with patient and she is agreeable.  Will schedule for am tomorrow.        Rithika Seel, MD,  FACC, FSCAI 09/03/2011, 8:39 AM    

## 2011-09-04 NOTE — Interval H&P Note (Signed)
History and Physical Interval Note:  09/04/2011 7:29 AM  [name]  has presented today for surgery, with the diagnosis of chest pain  The various methods of treatment have been discussed with the patient and family. After consideration of risks, benefits and other options for treatment, the patient has consented to  Procedure(s) (LRB): LEFT HEART CATHETERIZATION WITH CORONARY ANGIOGRAM (N/A) as a surgical intervention .  The patients' history has been reviewed, patient examined, no change in status, stable for surgery.  I have reviewed the patients' chart and labs.  Questions were answered to the patient's satisfaction.     Cristiano Capri

## 2011-09-05 ENCOUNTER — Inpatient Hospital Stay (HOSPITAL_COMMUNITY): Payer: Medicaid Other

## 2011-09-05 DIAGNOSIS — K219 Gastro-esophageal reflux disease without esophagitis: Secondary | ICD-10-CM

## 2011-09-05 DIAGNOSIS — R079 Chest pain, unspecified: Principal | ICD-10-CM

## 2011-09-05 LAB — CBC
MCV: 87 fL (ref 78.0–100.0)
Platelets: 258 10*3/uL (ref 150–400)
RBC: 3.92 MIL/uL (ref 3.87–5.11)
RDW: 14.7 % (ref 11.5–15.5)
WBC: 6 10*3/uL (ref 4.0–10.5)

## 2011-09-05 MED ORDER — METOPROLOL TARTRATE 50 MG PO TABS: 50.0000 mg | ORAL_TABLET | Freq: Two times a day (BID) | ORAL | Status: DC

## 2011-09-05 MED ORDER — CLOPIDOGREL BISULFATE 75 MG PO TABS: 75.0000 mg | ORAL_TABLET | Freq: Every day | ORAL | Status: DC

## 2011-09-05 MED ORDER — LISINOPRIL 5 MG PO TABS: 5.0000 mg | ORAL_TABLET | Freq: Every day | ORAL | Status: DC

## 2011-09-05 NOTE — Discharge Instructions (Signed)
PLEASE REMEMBER TO BRING ALL OF YOUR MEDICATIONS TO EACH OF YOUR FOLLOW-UP OFFICE VISITS.  Activity: Increase activity slowly as tolerated. You may shower, but no soaking baths for 1 week. No driving for 2 days. No lifting over 5 lbs for 1 week. No sexual activity for 1 week.   You May Return to Work: in 1 week (if applicable)  Wound Care: You may wash cath site gently with soap and water. Keep cath site clean and dry. If you notice pain, swelling, bleeding or pus at your cath site, please call [phone].  

## 2011-09-05 NOTE — Discharge Summary (Signed)
Discharge Summary   Patient ID: [name],  MRN: [id number], DOB/AGE: [birth year] 61 y.o.  Admit date: 09/03/2011 Discharge date: 09/05/2011  Discharge Diagnoses Principal Problem:   *Chest pain  - Cardiac biomarkers cycled, negative x 3   09/03/2011 04:00 09/03/2011 09:06 09/03/2011 17:26  CK, MB 2.5 2.2 2.6  CK Total 22 17 18   Troponin I <0.30 <0.30 <0.30     - 2D echo 09/03/11: poor quality, LVEF 55-60%, mild AS, mild LA dilatation  - Cardiac cath 09/04/11: nonobstructive, patent RCA, LAD, LCx; LVEF 60-65%  - Resolved shortly after admission, no further events  - Medical management- ASA/Plavix/ACEi/BB/statin/Imdur/NTG SL PRN on discharge  Active Problems:  HYPOTHYROIDISM  - TSH WNL this admission  - Continued on Synthroid  HYPERTENSION  - Low-dose ACEi added this admission with good control  - Follow-up per PCP (appointment on Monday 09/08/11)  CAD  - Medically managed as above  - Continue Imdur for anti-anginal benefit  - ACEi added, Lopressor up-titrated; will continue given good BP response and control  - Follow-up at [facility]. clinic as scheduled below  GERD (gastroesophageal reflux disease)  - Stable  Allergies No Known Allergies  Diagnostic Studies/Procedures  2D echocardiogram without contrast- 09/03/11  Study Conclusions  - Left ventricle: Tech indicates mid cavitary gradient but I could not appreciate due to poor image quality. CW signal looked like MR The cavity size was normal. Wall thickness was normal. Systolic function was normal. The estimated ejection fraction was in the range of 55% to 60%. - Aortic valve: There was mild stenosis. - Left atrium: The atrium was mildly dilated. - Atrial septum: No defect or patent foramen ovale was identified. Transthoracic echocardiography. M-mode, complete 2D, spectral Doppler, and color Doppler. Height: Height: 152.4cm. Height: 60in. Weight: Weight: 96.1kg. Weight: 211.4lb. Body mass index: BMI:  41.4kg/m^2. Body surface area: BSA: 2.66m^2. Blood pressure: 172/84. Patient status: Inpatient. Location: ICU/CCU ------------------------------------------------------------ ------------------------------------------------------------ Left ventricle: Tech indicates mid cavitary gradient but I could not appreciate due to poor image quality. CW signal looked like MR The cavity size was normal. Wall thickness was normal. Systolic function was normal. The estimated ejection fraction was in the range of 55% to 60%. ------------------------------------------------------------ Aortic valve: Poorly visualized. Doppler: There was mild stenosis. Mean gradient: 14mm Hg (S). Peak gradient: 24mm Hg (S). ------------------------------------------------------------ Aorta: The aorta was normal, not dilated, and non-diseased. ------------------------------------------------------------ Mitral valve: Mildly thickened leaflets . Doppler: Trivial regurgitation. Peak gradient: 3mm Hg (D). ------------------------------------------------------------ Left atrium: The atrium was mildly dilated. ------------------------------------------------------------ Atrial septum: No defect or patent foramen ovale was identified. ------------------------------------------------------------ Right ventricle: The cavity size was normal. Wall thickness was normal. Systolic function was normal. ------------------------------------------------------------ Pulmonic valve: Doppler: Trivial regurgitation. ------------------------------------------------------------ Tricuspid valve: Doppler: Mild regurgitation. ------------------------------------------------------------ Right atrium: The atrium was normal in size. ------------------------------------------------------------ Pericardium: The pericardium was normal in appearance. ------------------------------------------------------------ Post procedure  conclusions Ascending Aorta:  - The aorta was normal, not dilated, and non-diseased. ------------------------------------------------------------ 2D measurements Normal Doppler Normal Left ventricle measurements LVID ED, 42.9 mm 43-52 Left ventricle chord, Ea, lat 7.6 cm/ ------- PLAX ann, tiss s LVID ES, 30.5 mm 23-38 DP chord, E/Ea, lat 10.71 ------- PLAX ann, tiss FS, chord, 29 % >29 DP PLAX Ea, med 6.14 cm/ ------- LVPW, ED 9.37 mm ------ ann, tiss s IVS/LVPW 0.98 <1.3 DP ratio, ED E/Ea, med 13.26 ------- Ventricular septum ann, tiss IVS, ED 9.16 mm ------ DP Aorta Aortic valve Root diam, 30 mm ------ Peak vel, S 247 cm/ ------- ED s  Left atrium Mean vel, S 170 cm/ ------- AP dim 42 mm ------ s AP dim 2.03 cm/m^2 <2.2 VTI, S 49.4 cm ------- index Mean 14 mm ------- gradient, S Hg Peak 24 mm ------- gradient, S Hg Mitral valve Peak E vel 81.4 cm/ ------- s Peak A vel 93.8 cm/ ------- s Deceleratio 285 ms 150-230 n time Peak 3 mm ------- gradient, D Hg Peak E/A 0.9 ------- ratio Right ventricle Sa vel, lat 14.5 cm/ ------- ann, tiss s DP  Cardiac catheterization- 09/04/11  Procedure Performed:  1. Left Heart Catheterization 2. Selective Coronary Angiography 3. Left ventricular angiogram Operator: Verne Carrow, MD  Indication: Known CAD, pt of Dr. Riley Kill. He saw her yesterday am after she was admitted with chest pain. Negative cardiac enzymes.  Procedure Details:  The risks, benefits, complications, treatment options, and expected outcomes were discussed with the patient. The patient and/or family concurred with the proposed plan, giving informed consent. The patient was brought to the cath lab after IV hydration was begun and oral premedication was given. The patient was further sedated with Versed and Fentanyl. Allens test was negative on the right wrist. The right groin was prepped and draped in the usual manner. Using the modified Seldinger access  technique, a 5 French sheath was placed in the right femoral artery. Standard diagnostic catheters were used to perform selective coronary angiography. A pigtail catheter was used to perform a left ventricular angiogram.  There were no immediate complications. The patient was taken to the recovery area in stable condition.  Hemodynamic Findings:  Central aortic pressure: 158/79  Left ventricular pressure: 163/7/21  Angiographic Findings:  Left main: No obstructive disease.  Left Anterior Descending Artery: Large caliber vessel that courses to the apex. Patent stents mid LAD. There is mild plaque in the mid LAD and distal LAD. No obstructive lesions.  Circumflex Artery: Large caliber vessel. Patent stent mid Circumflex. OM branch is patent.  Right Coronary Artery: Large dominant vessel with patent proximal and mid stents. Heavily calcified vessel.  Left Ventricular Angiogram: LVEF 55%.  Impression:  1. Triple vessel CAD with patent stents in the mid LAD, mid Circumflex and prox/mid RCA  2. Preserved LV systolic function  Recommendations: Continued medical management per Dr. Riley Kill.  Complications: None. The patient tolerated the procedure well.   CHEST X-RAY- 09/05/11  Comparison: Multiple priors, dating back to 10/28/2008. The  Findings: Vague nodular density is present in the right mid and  lower lung field. This is not seen on the lateral view. This was  not present on prior examinations. This is suspected to represent  summation shadows.  No airspace disease. No effusion. Calcified aortic arch.  Cardiopericardial silhouette appears within normal limits.  IMPRESSION:  1. No acute cardiopulmonary disease.  2. Vague nodular density in the right mid to lower chest. 6-week  follow-up PA and lateral recommended to reassess. Favor summation  shadows over pulmonary nodule. If the nodule persists, chest CT  may be necessary.  History of Present Illness   Ms. Seto is a 61 yo Caucasian  female with the above problem list who was admitted on 09/03/11 for chest pain.   She had recently run out of Plavix, and was scheduled for an outpatient visit for a refill the day of admission. She had reportedly experienced increasing, intermittent sharp chest pain radiating to back and associated with left arm numbness and jaw pain for three days that resolved with NTG SL. This was associated with SOB and nausea. She  presented to the [facility] ED. Initial cardiac biomarkers revealed a mildly elevated troponin (0.04- cutoff 0.03). She was subsequently started on heparin gtt and transferred to [facility] ED for anticipated cardiac catheterization. EKG on arrival was non-acute.   Hospital Course   The patient continued to have chest pain. She was resumed on heparin and nitroglycerin. She was started on low-dose ACEi with good BP control. Lopressor was up-titrated to 50mg  BID. Outpatient medications were continued. She remained NPO. Cardiac biomarkers were cycled and found to be negative x 3. TSH WNL. 2D echo was ordered with the above findings. She was informed, consented and prepped for cardiac cath which is outlined above. This revealed nonobstructive 3-vessel CAD with widely patent stents in LAD, LCx, RCA. The recommendation was made for continued medical therapy. She tolerated the procedure well without complications, and was transferred to CRU in good condition. There were no further events and her access site remained stable.   Today, CXR was ordered and detailed above. This was notable for an area of focal density believed to represent summation shadowing over pulmonary nodule; however, repeat CXR was recommended in 6 months. If finding persists, follow-up CT scan recommended. The will be followed (history of tobacco abuse). This was discussed with the patient who understood and agreed. Questions were answered.   She was assessed by Dr. Riley Kill and felt to be stable for discharge. She will continue  medications and attend follow-up appointments as outlined below. BP will need to be monitored with the addition of ACEi therapy, up-titration of Lopressor this admission, and resumption of Imdur outpatient. She has an appointment with her PCP on Monday where this can be addressed in the short-term. She will follow-up with Ward Givens, NP as outlined below. Dr. Riley Kill will be in clinic this day also for post-discharge follow-up. This information, including activity restrictions, is clearly outlined in the discharge AVS.   Discharge Vitals:  Blood pressure 117/62, pulse 62, temperature 98.1 F (36.7 C), temperature source Oral, resp. rate 18, height 5' (1.524 m), weight 97 kg (213 lb 13.5 oz), SpO2 96.00%.   Weight change: -1 kg (-2 lb 3.3 oz)  Labs: Recent Labs  [facility] 09/05/11 0420 09/04/11 0340   WBC 6.0 7.3   HGB 11.3* 12.5   HCT 34.1* 37.5   MCV 87.0 86.2   PLT 258 292    Basename 09/03/11 0906  VITAMINB12 --  FOLATE --  FERRITIN 235  TIBC --  IRON --  RETICCTPCT --    Lab 09/04/11 0340 09/03/11 0400  NA 130* 132*  K 3.8 3.7  CL 94* 95*  CO2 21 19  BUN 7 8  CREATININE 0.67 0.68  CALCIUM 9.3 9.4  PROT -- 6.8  BILITOT -- 0.3  ALKPHOS -- 55  ALT -- 14  AST -- 20  AMYLASE -- --  LIPASE -- --  GLUCOSE 84 73   Recent Labs  Basename 09/03/11 1726 09/03/11 0906 09/03/11 0400   CKTOTAL 18 17 22    CKMB 2.6 2.2 2.5   CKMBINDEX -- -- --   TROPONINI <0.30 <0.30 <0.30    Basename 09/03/11 0906  TSH 0.732  T4TOTAL --  T3FREE --  THYROIDAB --    Disposition:   Discharge Medications:  Medication List  As of 09/05/2011  8:44 AM   ASK your doctor about these medications         ALPRAZolam 0.5 MG tablet   Commonly known as: XANAX      aspirin EC  81 MG tablet      clopidogrel 75 MG tablet   Commonly known as: PLAVIX      docusate sodium 100 MG capsule   Commonly known as: COLACE      gabapentin 300 MG capsule   Commonly known as: NEURONTIN       HYDROcodone-acetaminophen 7.5-500 MG per tablet   Commonly known as: LORTAB      isosorbide mononitrate 60 MG 24 hr tablet   Commonly known as: IMDUR   Take 1 tablet (60 mg total) by mouth daily.      levothyroxine 50 MCG tablet   Commonly known as: SYNTHROID, LEVOTHROID   Take 1 tablet (50 mcg total) by mouth daily.      meloxicam 15 MG tablet   Commonly known as: MOBIC      methocarbamol 750 MG tablet   Commonly known as: ROBAXIN      metoprolol 50 MG tablet   Commonly known as: LOPRESSOR   TAKE ONE-HALF TABLET TWICE A DAY      multivitamin tablet      * nitroGLYCERIN 0.4 MG SL tablet   Commonly known as: NITROSTAT   Place 1 tablet (0.4 mg total) under the tongue every 5 (five) minutes as needed.      * nitroGLYCERIN 0.4 MG SL tablet   Commonly known as: NITROSTAT      simvastatin 80 MG tablet   Commonly known as: ZOCOR   Take 1 tablet (80 mg total) by mouth at bedtime.     * Notice: This list has 2 medication(s) that are the same as other medications prescribed for you. Read the directions carefully, and ask your doctor or other care provider to review them with you.         Outstanding Labs/Studies: BMET in 09/17/11 Duration of Discharge Encounter: Greater than 30 minutes including physician time.  Signed, R. Hurman Horn, PA-C 09/05/2011, 8:44 AM  agrre with the above plan.  She has a minimally abnormal CXR which needs follow up.  I reminded her of this.  She will see me back in two weeks.  She does need a bmet as well.

## 2011-09-05 NOTE — Plan of Care (Signed)
Problem: Consults Goal: Chest Pain Patient Education (See Patient Education module for education specifics.)  Outcome: Completed/Met Date Met:  09/05/11 Patient has pain with clean cath, no complaints of chest pain tonight, complaints of back pain and headache only  Problem: Phase I Progression Outcomes Goal: Hemodynamically stable Outcome: Completed/Met Date Met:  09/05/11 Blood pressure stable Goal: Anginal pain relieved Outcome: Completed/Met Date Met:  09/05/11 No complaints of chest pain tonight  Problem: Phase II Progression Outcomes Goal: Cardiac Rehab if ordered Outcome: Not Applicable Date Met:  09/05/11 Patient had cath with no significant stenosis and no MI

## 2011-09-05 NOTE — Progress Notes (Signed)
Patient Name: [name] Date of Encounter: 09/05/2011     Principal Problem:  *CAD Active Problems:  HYPOTHYROIDISM  HYPERTENSION  Chest pain  GERD (gastroesophageal reflux disease)  SUBJECTIVE: Feels well this AM. In good spirits. Would like to go home. Specifically denies chest pain, shortness of breath, palpitations, diaphoresis, n/v or lightheadedness.   OBJECTIVE  Filed Vitals:   09/04/11 1300 09/04/11 1950 09/05/11 0045 09/05/11 0503  BP: 123/58 124/57 99/54 112/48  Pulse: 75 76 55 63  Temp: 98.1 F (36.7 C) 97.9 F (36.6 C) 98.3 F (36.8 C) 97.6 F (36.4 C)  TempSrc: Oral Oral Oral Oral  Resp: 18 16 18 20   Height:      Weight:   97 kg (213 lb 13.5 oz)   SpO2: 98% 98% 96% 98%    Intake/Output Summary (Last 24 hours) at 09/05/11 0757 Last data filed at 09/04/11 1600  Gross per 24 hour  Intake      0 ml  Output    650 ml  Net   -650 ml   Weight change: -1 kg (-2 lb 3.3 oz)  PHYSICAL EXAM  General: Morbidly obese, well developed, in no acute distress. Head: Normocephalic, atraumatic, sclera non-icteric, no xanthomas, nares are without discharge.  Neck: Supple without bruits or JVD. Lungs:  Resp regular and unlabored, CTAB without wheezes, rales or rhonchi Heart: II/VI systolic crescendo-decrescendo murmur at RUSB; II/VI systolic murmur at LLSB radiating to apex. RRR no s3, s4, or murmurs. Abdomen: Soft, non-tender, non-distended, BS + x 4.  Msk:  Strength and tone appears normal for age. Extremities: R groin access site without discharge, bleeding, tenderness, induration or bruit. No clubbing, cyanosis or edema. DP/PT/Radials 2+ and equal bilaterally. Neuro: Alert and oriented X 3. Moves all extremities spontaneously. Psych: Normal affect.  LABS:  Recent Labs  [facility] 09/05/11 0420 09/04/11 0340   WBC 6.0 7.3   HGB 11.3* 12.5   HCT 34.1* 37.5   MCV 87.0 86.2   PLT 258 292    Basename 09/03/11 0906  VITAMINB12 --  FOLATE --    FERRITIN 235  TIBC --  IRON --  RETICCTPCT --   Lab 09/04/11 0340 09/03/11 0400  NA 130* 132*  K 3.8 3.7  CL 94* 95*  CO2 21 19  BUN 7 8  CREATININE 0.67 0.68  CALCIUM 9.3 9.4  PROT -- 6.8  BILITOT -- 0.3  ALKPHOS -- 55  ALT -- 14  AST -- 20  AMYLASE -- --  LIPASE -- --  GLUCOSE 84 73   Recent Labs  Basename 09/03/11 1726 09/03/11 0906 09/03/11 0400   CKTOTAL 18 17 22    CKMB 2.6 2.2 2.5   CKMBINDEX -- -- --   TROPONINI <0.30 <0.30 <0.30   Basename 09/03/11 0906  TSH 0.732  T4TOTAL --  T3FREE --  THYROIDAB --   TELE: NSR, 60-80 bpm  Radiology/Studies:  No results found.  Current Medications:     . ALPRAZolam  0.5 mg Oral BID  . antiseptic oral rinse  15 mL Mouth Rinse BID  . aspirin  81 mg Oral Daily  . atorvastatin  40 mg Oral q1800  . clopidogrel  75 mg Oral Q breakfast  . docusate sodium  100 mg Oral BID  . gabapentin  300 mg Oral TID  . hydrALAZINE  10 mg Intravenous Once  . HYDROcodone-acetaminophen  1 tablet Oral Q6H  . levothyroxine  50 mcg Oral QAC breakfast  . lisinopril  5 mg Oral Daily  . methocarbamol  750 mg Oral TID  . metoprolol  50 mg Oral BID  . DISCONTD: metoprolol  25 mg Oral BID  . DISCONTD: sodium chloride  3 mL Intravenous Q12H  . DISCONTD: sodium chloride  3 mL Intravenous Q12H   ASSESSMENT AND PLAN:  1. Chest pain- cath yesterday revealed patent LAD, RCA, LCx stents and nonobstructive CAD; LVEF 55%. Continue medical therapy. No events overnight. Feels well this morning. R groin access site without evidence of bleeding, discharge, hematoma or pseudoaneurysm. Likely can be discharged today with PCP follow-up for HTN management.   - Continue ASA/Plavix/BB/statin/NTG SL PRN  - ACEi added with good BP control, continue  - Imdur on outpatient meds, would continue for anti-anginal benefit  - Will discuss disposition with MD  2. CAD- as above. Echo 06/12 revealed LVEF 55-60%; mild AS, trivial MR, mild LA dilatation (technically poor  study). Continue medical management. Had run out of Plavix prior to admission. Will ensure refills are provided at discharge.   3. HTN- well-controlled with the addition of ACEi.   - Continue current antihypertensives, will need to monitor if Imdur re-initiated on discharge  - Follow-up and management by PCP (patient has appointment on Monday)  4. GERD- stable   5. Hypothyroidism- stable  - Continue outpatient Synthroid regimen  Signed, R. Hurman Horn, PA-C 09/05/2011, 7:57 AM  Patient seen and examined, and images of cath brought to her.  She is stable and currently without symptoms.  It is not quite clear what brought her to attention.  She does not have LE swelling or signs of DVT.  Her coronary anatomy is widely patent despite the multiple stents.  Exam today is unremarkable.  Lungs are clear. Groin site is healed.  CXR reveals a vague density, and this will require a follow up be done in about six weeks.  I will make arrangements to see her back in follow up in about two weeks.  We have added lisinopril to her regimen for better BP control which may have been participatory in her symptoms.  After I see her back, I will make arrangements for her to have long term follow up in [place] with Dr. Diona Browner.  More than thirty minutes of combined dc time.

## 2011-09-17 ENCOUNTER — Other Ambulatory Visit: Payer: Self-pay

## 2011-09-17 ENCOUNTER — Encounter: Payer: Self-pay | Admitting: Nurse Practitioner

## 2011-09-19 ENCOUNTER — Encounter: Payer: Self-pay | Admitting: Nurse Practitioner

## 2011-09-19 ENCOUNTER — Other Ambulatory Visit: Payer: Self-pay

## 2011-09-23 ENCOUNTER — Encounter: Payer: Self-pay | Admitting: Nurse Practitioner

## 2011-09-23 ENCOUNTER — Telehealth: Payer: Self-pay | Admitting: *Deleted

## 2011-09-23 ENCOUNTER — Other Ambulatory Visit: Payer: Self-pay

## 2011-09-23 NOTE — Telephone Encounter (Signed)
Left message for patient to call us. She missed her appointment today and let her know on message that she will need a follow up CXR in about 6 weeks from last one that will be around October 17 2011.

## 2011-10-02 ENCOUNTER — Telehealth: Payer: Self-pay | Admitting: *Deleted

## 2011-10-02 DIAGNOSIS — I10 Essential (primary) hypertension: Secondary | ICD-10-CM

## 2011-10-02 NOTE — Telephone Encounter (Signed)
Message left on nurse's voicemail that she was informed that she needed lab work by receptionist. Nurse returned call and informed patient that during admission to [facility] 09/16/11, the requested lab work was done and will be scanned into chart and routed to requested provider.

## 2011-10-02 NOTE — Telephone Encounter (Signed)
After reviewing chart further, nurse discovered that labs that were seen at [facility] were from 08/2010 and she did need BMET that was requested. Nurse called patient back and informed her to go to [facility] lab and have BMET done. Patient stated she would go next Wednesday on the 10/08/11. Lab order faxed to [facility] lab.

## 2011-10-03 ENCOUNTER — Encounter: Payer: Self-pay | Admitting: Nurse Practitioner

## 2011-10-03 ENCOUNTER — Other Ambulatory Visit: Payer: Self-pay

## 2011-10-10 ENCOUNTER — Telehealth: Payer: Self-pay | Admitting: *Deleted

## 2011-10-10 DIAGNOSIS — E876 Hypokalemia: Secondary | ICD-10-CM

## 2011-10-10 MED ORDER — POTASSIUM CHLORIDE CRYS ER 10 MEQ PO TBCR: 40.0000 meq | EXTENDED_RELEASE_TABLET | ORAL | Status: DC

## 2011-10-10 NOTE — Telephone Encounter (Signed)
Message copied by Eustace Moore on Fri Oct 10, 2011  2:24 PM ------      Message from: Rollene Rotunda      Created: Fri Oct 10, 2011  2:17 PM       I spoke with our office staff.  The patient needs to restrict her fluid to 1500cc free water.  She will be given KDur 40 meq x 1 and she will be seen in the office next week for follow up.  She needs a repeat BMET early next week.  Her Na is 121.

## 2011-10-10 NOTE — Telephone Encounter (Signed)
Patient informed and verbalized understanding of plan. Patient coming for office visit on Monday at 10:30am. Lab order will be given to patient at that time.

## 2011-10-13 ENCOUNTER — Encounter: Payer: Self-pay | Admitting: Cardiology

## 2011-10-13 ENCOUNTER — Ambulatory Visit (INDEPENDENT_AMBULATORY_CARE_PROVIDER_SITE_OTHER): Payer: MEDICAID | Admitting: Cardiology

## 2011-10-13 VITALS — BP 140/70 | HR 72 | Ht 60.0 in | Wt 195.0 lb

## 2011-10-13 DIAGNOSIS — R918 Other nonspecific abnormal finding of lung field: Secondary | ICD-10-CM

## 2011-10-13 DIAGNOSIS — I2510 Atherosclerotic heart disease of native coronary artery without angina pectoris: Secondary | ICD-10-CM

## 2011-10-13 DIAGNOSIS — E876 Hypokalemia: Secondary | ICD-10-CM

## 2011-10-13 DIAGNOSIS — I359 Nonrheumatic aortic valve disorder, unspecified: Secondary | ICD-10-CM

## 2011-10-13 DIAGNOSIS — E871 Hypo-osmolality and hyponatremia: Secondary | ICD-10-CM

## 2011-10-13 DIAGNOSIS — R9389 Abnormal findings on diagnostic imaging of other specified body structures: Secondary | ICD-10-CM | POA: Insufficient documentation

## 2011-10-13 DIAGNOSIS — I350 Nonrheumatic aortic (valve) stenosis: Secondary | ICD-10-CM | POA: Insufficient documentation

## 2011-10-13 DIAGNOSIS — I10 Essential (primary) hypertension: Secondary | ICD-10-CM

## 2011-10-13 MED ORDER — POTASSIUM CHLORIDE CRYS ER 20 MEQ PO TBCR: 20.0000 meq | EXTENDED_RELEASE_TABLET | Freq: Every day | ORAL | Status: DC

## 2011-10-13 MED ORDER — LISINOPRIL 40 MG PO TABS: 40.0000 mg | ORAL_TABLET | Freq: Every day | ORAL | Status: DC

## 2011-10-13 MED ORDER — CLOPIDOGREL BISULFATE 75 MG PO TABS: 75.0000 mg | ORAL_TABLET | Freq: Every day | ORAL | Status: DC

## 2011-10-13 NOTE — Progress Notes (Signed)
Patient ID: [name], female   DOB: September 29, 1950, 61 y.o.   MRN: [id number] PCP: Dr. [name]  61 yo with history of CAD and PCI to multiple vessels presents for cardiology followup.  She has been seen by Dr. Riley Kill in the past but has trouble getting to the office in [place] so will be followed in this office now.  She was admitted in 6/13 with chest pain.  She ruled out for MI but given her past history she had a left heart cath that showed patent stents, no obstructive CAD.  She was also noted by CXR to have a left lung base density that needs followup.  Echo showed normal EF with mild AS.   Since getting home, she has been stable.  Occasional mild atypical chest pain but nothing severe.  She is essentially bed-bound due to her knees and her back.  She is unstable with walking and uses a wheelchair when she is not in bed.  She has some chronic dyspnea with transfers.  Main issue recently has been hyponatremia.  Labs done on 7/17 showed a sodium level of 121.  She was told to cut back on fluid intake and repeat labs were planned for today.  She and her husband deny and confusion or seizure-like activity.    Labs (7/13): Na 121, K 3.3, creatinine 0.83  PMH: 1. CAD: She has had multiple PCIs.  She has Promus DES in the LAD, DES in mid CFX, 3 overlapping DES in RCA, and she has had cutting balloon angioplasty to OM1.  Most recent intervention was DES to LAD in 09-11-07.  Last cath in 6/13 showed patent stents with no obstructive CAD.  Echo (6/13): EF 55-60% with mild AS. 2. Aortic stenosis: Mild, last echo in 6/13.  3. Obesity 4. Low back pain with disability and limited mobility.  5. HTN 6. Hypothyroidism 7. PUD 8. 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 GERD 10. Hyponatremia.  SH: Married, lives in [place].  Disabled (back) Charity fundraiser.  No smoking since 09/10/08.    FH: Father died of MI at 32, mother CABG at 23.   ROS: All systems reviewed and negative except as per HPI.   Current Outpatient Prescriptions    Medication Sig Dispense Refill  . ALPRAZolam (XANAX) 0.5 MG tablet Take 0.5 mg by mouth 2 (two) times daily.      Marland Kitchen aspirin EC 81 MG tablet Take 81 mg by mouth daily.      . clopidogrel (PLAVIX) 75 MG tablet Take 1 tablet (75 mg total) by mouth daily.  30 tablet  4  . docusate sodium (COLACE) 100 MG capsule Take 100 mg by mouth 2 (two) times daily as needed.       . DULoxetine (CYMBALTA) 30 MG capsule Take 30 mg by mouth daily.      Marland Kitchen HYDROcodone-acetaminophen (LORTAB) 7.5-500 MG per tablet Take 1 tablet by mouth every 6 (six) hours as needed. For pain      . isosorbide mononitrate (IMDUR) 60 MG 24 hr tablet Take 1 tablet (60 mg total) by mouth daily.  30 tablet  6  . levothyroxine (SYNTHROID, LEVOTHROID) 50 MCG tablet Take 1 tablet (50 mcg total) by mouth daily.  30 tablet  9  . lisinopril (PRINIVIL,ZESTRIL) 40 MG tablet Take 1 tablet (40 mg total) by mouth daily.  30 tablet  4  . methocarbamol (ROBAXIN) 750 MG tablet Take 750 mg by mouth 4 (four) times daily.      Marland Kitchen  morphine (MS CONTIN) 30 MG 12 hr tablet Take 30 mg by mouth 2 (two) times daily.      . Multiple Vitamin (MULTIVITAMIN) tablet Take 1 tablet by mouth daily.        . nitroGLYCERIN (NITROSTAT) 0.4 MG SL tablet Place 1 tablet (0.4 mg total) under the tongue every 5 (five) minutes as needed.  100 tablet  2  . potassium chloride SA (K-DUR,KLOR-CON) 20 MEQ tablet Take 1 tablet (20 mEq total) by mouth daily.  30 tablet  4  . ranitidine (ZANTAC) 150 MG tablet Take 150 mg by mouth 2 (two) times daily.      . simvastatin (ZOCOR) 80 MG tablet Take 1 tablet (80 mg total) by mouth at bedtime.  90 tablet  2    BP 140/70  Pulse 72  Ht 5' (1.524 m)  Wt 195 lb (88.451 kg)  BMI 38.08 kg/m2 General: NAD, obese Neck: No JVD, no thyromegaly or thyroid nodule.  Lungs: Clear to auscultation bilaterally with normal respiratory effort. CV: Nondisplaced PMI.  Heart regular S1/S2, no S3/S4, 2/6 early SEM RUSB.  No peripheral edema.  No carotid  bruit.  Pedal pulses difficult to palpate. Abdomen: Soft, nontender, no hepatosplenomegaly, no distention.  Neurologic: Alert and oriented x 3.  Psych: Normal affect. Extremities: No clubbing or cyanosis.

## 2011-10-13 NOTE — Assessment & Plan Note (Signed)
Recent cath with nonobstructive disease.  She continues to have mild, atypical chest pain. Given her multiple drug-eluting stents, I will have her continue Plavix long-term.  She will also stay on ASA 81, statin, ACEI.

## 2011-10-13 NOTE — Assessment & Plan Note (Signed)
BP runs high at times.  As I am going to stop HCTZ, I will increase lisinopril to 40 mg daily.  She has a cuff so I will have her check BP daily x 2 wks and record.  We will call her in wks to see what BP runs with these changes.

## 2011-10-13 NOTE — Assessment & Plan Note (Signed)
Left lung base density on recent CXR.  Repeat CXR as recommended to ensure resolution.

## 2011-10-13 NOTE — Assessment & Plan Note (Signed)
Mild by echo and exam.

## 2011-10-13 NOTE — Patient Instructions (Addendum)
Your physician recommends that you schedule a follow-up appointment in: 3 months with Dr. Shirlee Latch. You will receive a reminder letter in the mail in about 1-2 months reminding you to call and schedule your appointment. If you don't receive this letter, please contact our office.  Your physician has recommended you make the following change in your medication: STOP LISINOPRIL/HCTZ. START PLAIN LISINOPRIL 40 MG DAILY. START POTASSIUM CHLORIDE 20 MEQ DAILY. The new prescriptions have been sent to your pharmacy. Your physician recommends that you return for lab work in: today for BMET and again in 2 weeks for repeat bmet on August 5th 2013 at [facility]. Please have a chest xray done ON July 26TH 2013 at [facility]. A chest x-ray takes a picture of the organs and structures inside the chest, including the heart, lungs, and blood vessels. This test can show several things, including, whether the heart is enlarges; whether fluid is building up in the lungs; and whether pacemaker / defibrillator leads are still in place.  Your physician has requested that you regularly monitor and record your blood pressure readings at home. Please use the same machine at the same time of day to check your readings and record them. Please call our office in 2 weeks with these results or bring to the office.

## 2011-10-13 NOTE — Assessment & Plan Note (Signed)
?   Etiology.  She is on HCTZ.  She does report drinking a lot of water.  Last Na was 121.  She has cut back on fluid intake since then.  Reviewing her last sodium levels at [facility], I see that she has had mildly low sodium but never recorded as low as it was last week.  I will have her stop HCTZ and continue fluid restriction.  She is getting a BMET today and will repeat in 2 wks.

## 2011-10-13 NOTE — Assessment & Plan Note (Signed)
She will start KCl 20 mEq daily.

## 2011-10-15 ENCOUNTER — Telehealth: Payer: Self-pay | Admitting: *Deleted

## 2011-10-15 NOTE — Telephone Encounter (Signed)
Patient informed. 

## 2011-10-15 NOTE — Telephone Encounter (Signed)
Message copied by Eustace Moore on Wed Oct 15, 2011  3:13 PM ------      Message from: Laurey Morale      Created: Tue Oct 14, 2011  5:08 PM       Ok, no active disease

## 2011-10-15 NOTE — Telephone Encounter (Signed)
Message copied by Eustace Moore on Wed Oct 15, 2011  3:11 PM ------      Message from: Laurey Morale      Created: Mon Oct 13, 2011 12:14 PM       Sodium is improving.

## 2011-10-28 ENCOUNTER — Encounter: Payer: MEDICAID | Admitting: Cardiology

## 2011-12-25 ENCOUNTER — Other Ambulatory Visit: Payer: Self-pay | Admitting: Cardiology

## 2011-12-25 ENCOUNTER — Other Ambulatory Visit: Payer: Self-pay

## 2011-12-25 MED ORDER — ISOSORBIDE MONONITRATE ER 60 MG PO TB24: 60.0000 mg | ORAL_TABLET | Freq: Every day | ORAL | Status: DC

## 2011-12-25 NOTE — Telephone Encounter (Signed)
Needs to have IMDUR refilled [facility] [place] States that she has called pharmacy twice. She is completely out of medication

## 2011-12-31 ENCOUNTER — Other Ambulatory Visit: Payer: Self-pay | Admitting: Cardiology

## 2011-12-31 MED ORDER — SIMVASTATIN 80 MG PO TABS: 80.0000 mg | ORAL_TABLET | Freq: Every day | ORAL | Status: DC

## 2011-12-31 NOTE — Telephone Encounter (Signed)
simivastatin 80mg  needs refill sent to [facility]

## 2012-01-05 ENCOUNTER — Ambulatory Visit: Payer: MEDICAID | Admitting: Cardiology

## 2012-01-16 ENCOUNTER — Ambulatory Visit: Payer: Self-pay | Admitting: Cardiology

## 2012-02-18 ENCOUNTER — Ambulatory Visit (INDEPENDENT_AMBULATORY_CARE_PROVIDER_SITE_OTHER): Payer: Medicaid Other | Admitting: Cardiology

## 2012-02-18 ENCOUNTER — Encounter: Payer: Self-pay | Admitting: Cardiology

## 2012-02-18 VITALS — BP 125/77 | HR 82 | Ht 60.0 in | Wt 195.0 lb

## 2012-02-18 DIAGNOSIS — R9389 Abnormal findings on diagnostic imaging of other specified body structures: Secondary | ICD-10-CM

## 2012-02-18 DIAGNOSIS — R918 Other nonspecific abnormal finding of lung field: Secondary | ICD-10-CM

## 2012-02-18 DIAGNOSIS — I2510 Atherosclerotic heart disease of native coronary artery without angina pectoris: Secondary | ICD-10-CM

## 2012-02-18 DIAGNOSIS — Z79899 Other long term (current) drug therapy: Secondary | ICD-10-CM

## 2012-02-18 DIAGNOSIS — I10 Essential (primary) hypertension: Secondary | ICD-10-CM

## 2012-02-18 DIAGNOSIS — E871 Hypo-osmolality and hyponatremia: Secondary | ICD-10-CM

## 2012-02-18 DIAGNOSIS — I359 Nonrheumatic aortic valve disorder, unspecified: Secondary | ICD-10-CM

## 2012-02-18 MED ORDER — ISOSORBIDE MONONITRATE ER 60 MG PO TB24: 60.0000 mg | ORAL_TABLET | Freq: Every day | ORAL | Status: DC

## 2012-02-18 MED ORDER — LEVOTHYROXINE SODIUM 50 MCG PO TABS: 50.0000 ug | ORAL_TABLET | Freq: Every day | ORAL | Status: DC

## 2012-02-18 NOTE — Progress Notes (Signed)
Patient ID: [name], female   DOB: 17-Jun-1950, 61 y.o.   MRN: [id number] PCP: Dr. Olena Leatherwood  61 yo with history of CAD and PCI to multiple vessels presents for cardiology followup.  She was admitted in 6/13 with chest pain.  She ruled out for MI but given her past history she had a left heart cath that showed patent stents, no obstructive CAD.  Echo showed normal EF with mild AS.  She has had trouble with hyponatremia as well.  At last appointment, I asked her to stop HCTZ but apparently this was continued, and she was admitted twice with hyponatremia in the setting of ongoing HCTZ use + vomiting.  The first time, she had to get hypertonic saline.  She had a portacath placed for lab access.   Recently, she has been stable.  Occasional mild atypical chest pain but nothing severe.  She is essentially bed-bound due to her knees and her back.  She is unstable with walking and uses a wheelchair when she is not in bed.  She has some chronic dyspnea with transfers.  She is taking simvastatin 80 mg daily but has been on this long-term with no problems.   Labs (7/13): Na 121 => 127, K 3.3 => 3.6, creatinine 0.83  PMH: 1. CAD: She has had multiple PCIs.  She has Promus DES in the LAD, DES in mid CFX, 3 overlapping DES in RCA, and she has had cutting balloon angioplasty to OM1.  Most recent intervention was DES to LAD in 2007-09-15.  Last cath in 6/13 showed patent stents with no obstructive CAD.  Echo (6/13): EF 55-60% with mild AS. 2. Aortic stenosis: Mild, last echo in 6/13.  3. Obesity 4. Low back pain with disability and limited mobility.  5. HTN 6. Hypothyroidism 7. PUD 8. 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 GERD 10. Hyponatremia.  SH: Married, lives in [place].  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    FH: Father died of MI at 54, mother CABG at 21.   ROS: All systems reviewed and negative except as per HPI.   Current Outpatient Prescriptions  Medication Sig Dispense Refill  . ALPRAZolam (XANAX) 0.5  MG tablet Take 0.5 mg by mouth 2 (two) times daily.      Marland Kitchen aspirin EC 81 MG tablet Take 81 mg by mouth daily.      . benazepril (LOTENSIN) 40 MG tablet Take 40 mg by mouth daily.      . clopidogrel (PLAVIX) 75 MG tablet Take 1 tablet (75 mg total) by mouth daily.  30 tablet  4  . docusate sodium (COLACE) 100 MG capsule Take 100 mg by mouth 2 (two) times daily as needed.       . DULoxetine (CYMBALTA) 30 MG capsule Take 30 mg by mouth daily.      Marland Kitchen HYDROcodone-acetaminophen (LORTAB) 7.5-500 MG per tablet Take 1 tablet by mouth 4 (four) times daily. For pain      . isosorbide mononitrate (IMDUR) 60 MG 24 hr tablet Take 1 tablet (60 mg total) by mouth daily.  30 tablet  6  . levothyroxine (SYNTHROID, LEVOTHROID) 50 MCG tablet Take 1 tablet (50 mcg total) by mouth daily.  30 tablet  6  . methocarbamol (ROBAXIN) 750 MG tablet Take 750 mg by mouth 4 (four) times daily.      Marland Kitchen morphine (MS CONTIN) 30 MG 12 hr tablet Take 30 mg by mouth 2 (two) times daily.      Marland Kitchen  Multiple Vitamin (MULTIVITAMIN) tablet Take 1 tablet by mouth daily.        . nitroGLYCERIN (NITROSTAT) 0.4 MG SL tablet Place 1 tablet (0.4 mg total) under the tongue every 5 (five) minutes as needed.  100 tablet  2  . potassium chloride SA (K-DUR,KLOR-CON) 20 MEQ tablet Take 1 tablet (20 mEq total) by mouth daily.  30 tablet  4  . ranitidine (ZANTAC) 150 MG tablet Take 150 mg by mouth 2 (two) times daily.      . simvastatin (ZOCOR) 80 MG tablet Take 1 tablet (80 mg total) by mouth at bedtime.  90 tablet  2  . [DISCONTINUED] isosorbide mononitrate (IMDUR) 60 MG 24 hr tablet TAKE ONE TABLET BY MOUTH EVERY DAY  30 tablet  5  . [DISCONTINUED] levothyroxine (SYNTHROID, LEVOTHROID) 50 MCG tablet Take 1 tablet (50 mcg total) by mouth daily.  30 tablet  9  . [DISCONTINUED] isosorbide mononitrate (IMDUR) 60 MG 24 hr tablet Take 1 tablet (60 mg total) by mouth daily.  30 tablet  6    BP 125/77  Pulse 82  Ht 5' (1.524 m)  Wt 195 lb (88.451 kg)  BMI  38.08 kg/m2 General: NAD, obese Neck: No JVD, no thyromegaly or thyroid nodule.  Lungs: Clear to auscultation bilaterally with normal respiratory effort. CV: Nondisplaced PMI.  Heart regular S1/S2, no S3/S4, 2/6 early SEM RUSB.  No peripheral edema.  No carotid bruit.  Pedal pulses difficult to palpate. Abdomen: Soft, nontender, no hepatosplenomegaly, no distention.  Neurologic: Alert and oriented x 3.  Psych: Normal affect. Extremities: No clubbing or cyanosis.   Assessment/Plan:  CAD  Recent cath with nonobstructive disease. She continues to have mild, atypical chest pain. Given her multiple drug-eluting stents, I will have her continue Plavix long-term. She will also stay on ASA 81, statin, ACEI.  Hyponatremia  ? Etiology. She was admitted twice this fall, once requiring hypertonic saline.  At least the first admission was associated with use of HCTZ and vomiting.  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  No history of malignancy; recent CXR was unremarkable.  - Should not use thiazide diuretics in the future.  - Will get repeat BMET to follow sodium.   HYPERTENSION  BP appears controlled.  AS (aortic stenosis)  Mild by echo and exam.  Abnormal CXR Left lung base density on CXR this summer. Repeat CXR in 7/13 showed no mass or density, just elevated right hemidiaphragm.  Hyperlipidemia She is on simvastatin 80 mg daily but has been on this dose of simvastatin long-term with no complications.  Will get fasting lipids.   Marca Ancona 02/18/2012 2:01 PM

## 2012-02-18 NOTE — Patient Instructions (Signed)
   Labs - fasting lipid panel, BMET  Office will contact with results Continue all current medications. Follow up in  2 months

## 2012-04-07 ENCOUNTER — Ambulatory Visit: Payer: Medicaid Other | Admitting: Cardiology

## 2012-05-04 ENCOUNTER — Encounter: Payer: Self-pay | Admitting: *Deleted

## 2012-05-10 ENCOUNTER — Ambulatory Visit: Payer: Medicaid Other | Admitting: Cardiology

## 2012-06-23 ENCOUNTER — Other Ambulatory Visit: Payer: Self-pay | Admitting: *Deleted

## 2012-06-23 MED ORDER — CLOPIDOGREL BISULFATE 75 MG PO TABS: 75.0000 mg | ORAL_TABLET | Freq: Every day | ORAL | Status: DC

## 2012-07-28 ENCOUNTER — Encounter: Payer: Self-pay | Admitting: Cardiology

## 2012-07-28 ENCOUNTER — Ambulatory Visit (INDEPENDENT_AMBULATORY_CARE_PROVIDER_SITE_OTHER): Payer: Medicare Other | Admitting: Cardiology

## 2012-07-28 VITALS — BP 138/86 | HR 65 | Ht 60.0 in | Wt 213.0 lb

## 2012-07-28 DIAGNOSIS — I10 Essential (primary) hypertension: Secondary | ICD-10-CM

## 2012-07-28 DIAGNOSIS — Z79899 Other long term (current) drug therapy: Secondary | ICD-10-CM

## 2012-07-28 DIAGNOSIS — I2510 Atherosclerotic heart disease of native coronary artery without angina pectoris: Secondary | ICD-10-CM

## 2012-07-28 DIAGNOSIS — I359 Nonrheumatic aortic valve disorder, unspecified: Secondary | ICD-10-CM

## 2012-07-28 DIAGNOSIS — I350 Nonrheumatic aortic (valve) stenosis: Secondary | ICD-10-CM

## 2012-07-28 DIAGNOSIS — E785 Hyperlipidemia, unspecified: Secondary | ICD-10-CM

## 2012-07-28 DIAGNOSIS — E871 Hypo-osmolality and hyponatremia: Secondary | ICD-10-CM

## 2012-07-28 MED ORDER — ATORVASTATIN CALCIUM 80 MG PO TABS: 80.0000 mg | ORAL_TABLET | Freq: Every day | ORAL | Status: DC

## 2012-07-28 NOTE — Patient Instructions (Addendum)
Your physician recommends that you schedule a follow-up appointment in: 6 months. You will receive a reminder letter in the mail in about 4 months reminding you to call and schedule your appointment. If you don't receive this letter, please contact our office. Your physician has recommended you make the following change in your medication: STOP SIMVASTATIN. START ATORVASTIN 80 MG DAILY. Your new prescription has been sent to your pharmacy. All other medications will remain the same. Your physician recommends that you return for a FASTING lipid/liver profile in 2 months around September 27, 2012 at [facility]. Please do not eat or drink after midnight when you have this done.

## 2012-07-30 NOTE — Progress Notes (Signed)
Patient ID: [name], female   DOB: 17-Jan-1951, 62 y.o.   MRN: [id number] PCP: Dr. Olena Leatherwood  62 yo with history of CAD and PCI to multiple vessels presents for cardiology followup.  She was admitted in 6/13 with chest pain.  She ruled out for MI but given her past history she had a left heart cath that showed patent stents, no obstructive CAD.  Echo showed normal EF with mild AS.  She has had trouble with hyponatremia as well but sodium has been normal when checked recently.   Lately, she has been stable.  Occasional mild atypical chest pain but nothing severe.  She is essentially bed-bound due to her knees and her back.  She transfers from wheelchair to bed without dyspnea.     Labs (7/13): Na 121 => 127, K 3.3 => 3.6, creatinine 0.83 Labs (2/14): K 3.8, creatinine 0.72, LDL 85, HDL 62  PMH: 1. CAD: She has had multiple PCIs.  She has Promus DES in the LAD, DES in mid CFX, 3 overlapping DES in RCA, and she has had cutting balloon angioplasty to OM1.  Most recent intervention was DES to LAD in 2007-08-26.  Last cath in 6/13 showed patent stents with no obstructive CAD.  Echo (6/13): EF 55-60% with mild AS. 2. Aortic stenosis: Mild, last echo in 6/13.  3. Obesity 4. Low back pain with disability and limited mobility.  5. HTN 6. Hypothyroidism 7. PUD 8. 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 GERD 10. 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  Worse on HCTZ.   SH: Married, lives in [place].  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    FH: Father died of MI at 60, mother CABG at 22.   ROS: All systems reviewed and negative except as per HPI.   Current Outpatient Prescriptions  Medication Sig Dispense Refill  . ALPRAZolam (XANAX) 0.5 MG tablet Take 0.5 mg by mouth 2 (two) times daily.      Marland Kitchen aspirin EC 81 MG tablet Take 81 mg by mouth daily.      . benazepril (LOTENSIN) 40 MG tablet Take 40 mg by mouth daily.      . clopidogrel (PLAVIX) 75 MG tablet Take 1 tablet (75 mg total) by mouth daily.  30 tablet  4  .  docusate sodium (COLACE) 100 MG capsule Take 100 mg by mouth 2 (two) times daily as needed.       . DULoxetine (CYMBALTA) 30 MG capsule Take 30 mg by mouth daily.      Marland Kitchen HYDROcodone-acetaminophen (LORTAB) 7.5-500 MG per tablet Take 1 tablet by mouth 4 (four) times daily. For pain      . isosorbide mononitrate (IMDUR) 60 MG 24 hr tablet Take 1 tablet (60 mg total) by mouth daily.  30 tablet  6  . levothyroxine (SYNTHROID, LEVOTHROID) 50 MCG tablet Take 1 tablet (50 mcg total) by mouth daily.  30 tablet  6  . methocarbamol (ROBAXIN) 750 MG tablet Take 750 mg by mouth 4 (four) times daily.      Marland Kitchen morphine (MS CONTIN) 30 MG 12 hr tablet Take 30 mg by mouth 2 (two) times daily.      . Multiple Vitamin (MULTIVITAMIN) tablet Take 1 tablet by mouth daily.        . nitroGLYCERIN (NITROSTAT) 0.4 MG SL tablet Place 1 tablet (0.4 mg total) under the tongue every 5 (five) minutes as needed.  100 tablet  2  . potassium chloride SA (K-DUR,KLOR-CON) 20 MEQ tablet  Take 1 tablet (20 mEq total) by mouth daily.  30 tablet  4  . ranitidine (ZANTAC) 150 MG tablet Take 150 mg by mouth 2 (two) times daily.      Marland Kitchen atorvastatin (LIPITOR) 80 MG tablet Take 1 tablet (80 mg total) by mouth daily.  90 tablet  3  . meloxicam (MOBIC) 15 MG tablet Take 1 tablet by mouth daily.      . promethazine (PHENERGAN) 25 MG tablet as needed.       No current facility-administered medications for this visit.    BP 138/86  Pulse 65  Ht 5' (1.524 m)  Wt 213 lb (96.616 kg)  BMI 41.6 kg/m2 General: NAD, obese Neck: No JVD, no thyromegaly or thyroid nodule.  Lungs: Clear to auscultation bilaterally with normal respiratory effort. CV: Nondisplaced PMI.  Heart regular S1/S2, no S3/S4, 2/6 early SEM RUSB.  No peripheral edema.  No carotid bruit.  Pedal pulses difficult to palpate. Abdomen: Soft, nontender, no hepatosplenomegaly, no distention.  Neurologic: Alert and oriented x 3.  Psych: Normal affect. Extremities: No clubbing or  cyanosis.   Assessment/Plan:  CAD  Cath in 6/13 with nonobstructive disease. She continues to have mild, atypical chest pain. Given her multiple drug-eluting stents, I will have her continue Plavix long-term. She will also stay on ASA 81, statin, ACEI.  Hyponatremia  ? Etiology. 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  No history of malignancy.  Should not use thiazide diuretics in the future.  Most recent BMET with normal sodium.   HYPERTENSION  BP appears controlled.  AS (aortic stenosis)  Mild by echo and exam.  Hyperlipidemia She is on simvastatin 80 mg daily.  I would rather not have her on this dose of simvastatin.  I will have her stop this and take atorvastatin 40 mg daily instead with lipids/LFTs in 2 months.   Marca Ancona 07/30/2012

## 2012-08-19 HISTORY — PX: CARDIAC CATHETERIZATION: SHX172

## 2012-09-29 ENCOUNTER — Telehealth: Payer: Self-pay | Admitting: *Deleted

## 2012-09-29 NOTE — Telephone Encounter (Signed)
Patient called to inform our office that she was due for fasting lab work this month and was hospitalized twice this month. Patient wanted to know if we could use those labs that were done while she was admitted. Nurse informed patient that LFT's were done but FLP wasn't and she still needed to have this done. Nurse advised patient that she could have this done when she goes in August with oncologist.

## 2012-11-18 ENCOUNTER — Encounter: Payer: Self-pay | Admitting: Cardiovascular Disease

## 2012-12-20 ENCOUNTER — Other Ambulatory Visit: Payer: Self-pay | Admitting: *Deleted

## 2012-12-20 MED ORDER — LEVOTHYROXINE SODIUM 50 MCG PO TABS: 50.0000 ug | ORAL_TABLET | Freq: Every day | ORAL | Status: DC

## 2012-12-22 DIAGNOSIS — Z8719 Personal history of other diseases of the digestive system: Secondary | ICD-10-CM

## 2012-12-22 DIAGNOSIS — Z9289 Personal history of other medical treatment: Secondary | ICD-10-CM

## 2012-12-22 HISTORY — DX: Personal history of other medical treatment: Z92.89

## 2012-12-22 HISTORY — DX: Personal history of other diseases of the digestive system: Z87.19

## 2012-12-23 ENCOUNTER — Encounter: Payer: Self-pay | Admitting: Cardiovascular Disease

## 2013-01-31 ENCOUNTER — Encounter: Payer: Self-pay | Admitting: Cardiovascular Disease

## 2013-01-31 ENCOUNTER — Ambulatory Visit (INDEPENDENT_AMBULATORY_CARE_PROVIDER_SITE_OTHER): Payer: Medicare Other | Admitting: Cardiovascular Disease

## 2013-01-31 VITALS — BP 113/78 | HR 100 | Ht 60.0 in | Wt 195.0 lb

## 2013-01-31 DIAGNOSIS — I359 Nonrheumatic aortic valve disorder, unspecified: Secondary | ICD-10-CM

## 2013-01-31 DIAGNOSIS — I2510 Atherosclerotic heart disease of native coronary artery without angina pectoris: Secondary | ICD-10-CM

## 2013-01-31 DIAGNOSIS — D649 Anemia, unspecified: Secondary | ICD-10-CM

## 2013-01-31 DIAGNOSIS — E871 Hypo-osmolality and hyponatremia: Secondary | ICD-10-CM

## 2013-01-31 DIAGNOSIS — R0602 Shortness of breath: Secondary | ICD-10-CM

## 2013-01-31 DIAGNOSIS — Z79899 Other long term (current) drug therapy: Secondary | ICD-10-CM

## 2013-01-31 DIAGNOSIS — I350 Nonrheumatic aortic (valve) stenosis: Secondary | ICD-10-CM

## 2013-01-31 DIAGNOSIS — E785 Hyperlipidemia, unspecified: Secondary | ICD-10-CM

## 2013-01-31 DIAGNOSIS — I10 Essential (primary) hypertension: Secondary | ICD-10-CM

## 2013-01-31 MED ORDER — POTASSIUM CHLORIDE CRYS ER 20 MEQ PO TBCR: 20.0000 meq | EXTENDED_RELEASE_TABLET | Freq: Every day | ORAL | Status: DC

## 2013-01-31 MED ORDER — BENAZEPRIL HCL 40 MG PO TABS: 40.0000 mg | ORAL_TABLET | Freq: Every day | ORAL | Status: DC

## 2013-01-31 MED ORDER — ISOSORBIDE MONONITRATE ER 60 MG PO TB24: 60.0000 mg | ORAL_TABLET | Freq: Every day | ORAL | Status: DC

## 2013-01-31 MED ORDER — CLOPIDOGREL BISULFATE 75 MG PO TABS: 75.0000 mg | ORAL_TABLET | Freq: Every day | ORAL | Status: DC

## 2013-01-31 MED ORDER — ATORVASTATIN CALCIUM 80 MG PO TABS: 80.0000 mg | ORAL_TABLET | Freq: Every day | ORAL | Status: DC

## 2013-01-31 NOTE — Progress Notes (Signed)
Patient ID: [name], female   DOB: 1950-12-18, 62 y.o.   MRN: [id number]      SUBJECTIVE: [name] is a 62 yo woman with a history of CAD and PCI to multiple vessels, who presents for cardiology followup. She was admitted in 08/2011 with chest pain. She ruled out for an MI but given her past history she had a left heart cath that showed patent stents, and no obstructive CAD. Echo showed normal EF with mild AS. She has had trouble with hyponatremia in the past.   She is essentially bed-bound due to her knees and her back. She transfers from wheelchair to bed without dyspnea.   She was evaluated at [facility] on 12-23-2012 for a diverticular GI bleed, and was given IV fluids and a PRBC transfusion along with IV antibiotics. Labs: Na 138, K 3.9, BUN 5, creatinine 0.58, Hgb 8.3 (on admission). ECG showed NSR with a RBBB, 97 bpm.  She had been off of Plavix for 10 days but is taking it now without bleeding problems.  She gets short of breath when lying down moreso after her GI bleeding, and now sleeps with 3 pillows. She used to use home O2 in 08/12/09 for COPD, and wonders if she may need it again. Her sats go to 89% when she lies down. She denies PND.  She gets chest pain about twice a month, and relieved with 2 SL nitro tablets.  She is a retired Engineer, civil (consulting).  She is here with her husband, Onalee Hua.    PMH: 1. CAD: She has had multiple PCIs. She has Promus DES in the LAD, DES in mid CFX, 3 overlapping DES in RCA, and she has had cutting balloon angioplasty to OM1. Most recent intervention was DES to LAD in 08-13-07. Last cath in 6/13 showed patent stents with no obstructive CAD. Echo (6/13): EF 55-60% with mild AS.  2. Aortic stenosis: Mild, last echo in 6/13.  3. Obesity  4. Low back pain with disability and limited mobility.  5. HTN  6. Hypothyroidism  7. PUD 8. OA with h/o L TKR  9. GERD  10. 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 Worse on HCTZ.  11. 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  SH: Married, lives in [place]. Disabled (back) Charity fundraiser. No smoking since Aug 12, 2008.   FH: Father died of MI at 21, mother CABG at 66.   ROS: All systems reviewed and negative except as per HPI.      No Known Allergies  Current Outpatient Prescriptions  Medication Sig Dispense Refill  . ALPRAZolam (XANAX) 0.5 MG tablet Take 0.5 mg by mouth 2 (two) times daily.      Marland Kitchen aspirin EC 81 MG tablet Take 81 mg by mouth daily.      Marland Kitchen atorvastatin (LIPITOR) 80 MG tablet Take 1 tablet (80 mg total) by mouth daily.  90 tablet  3  . benazepril (LOTENSIN) 40 MG tablet Take 40 mg by mouth daily.      . clopidogrel (PLAVIX) 75 MG tablet Take 1 tablet (75 mg total) by mouth daily.  30 tablet  4  . docusate sodium (COLACE) 100 MG capsule Take 100 mg by mouth 2 (two) times daily as needed.       . DULoxetine (CYMBALTA) 30 MG capsule Take 30 mg by mouth daily.      Marland Kitchen HYDROcodone-acetaminophen (LORTAB) 7.5-500 MG per tablet Take 1 tablet by mouth 4 (four) times daily. For pain      .  isosorbide mononitrate (IMDUR) 60 MG 24 hr tablet Take 1 tablet (60 mg total) by mouth daily.  30 tablet  6  . levothyroxine (SYNTHROID, LEVOTHROID) 50 MCG tablet Take 1 tablet (50 mcg total) by mouth daily.  30 tablet  3  . meloxicam (MOBIC) 15 MG tablet Take 1 tablet by mouth daily.      . methocarbamol (ROBAXIN) 750 MG tablet Take 750 mg by mouth 4 (four) times daily.      Marland Kitchen morphine (MS CONTIN) 30 MG 12 hr tablet Take 30 mg by mouth 2 (two) times daily.      . Multiple Vitamin (MULTIVITAMIN) tablet Take 1 tablet by mouth daily.        . nitroGLYCERIN (NITROSTAT) 0.4 MG SL tablet Place 1 tablet (0.4 mg total) under the tongue every 5 (five) minutes as needed.  100 tablet  2  . potassium chloride SA (K-DUR,KLOR-CON) 20 MEQ tablet Take 1 tablet (20 mEq total) by mouth daily.  30 tablet  4  . promethazine (PHENERGAN) 25 MG tablet as needed.      . ranitidine (ZANTAC) 150 MG tablet Take 150 mg by mouth  2 (two) times daily.       No current facility-administered medications for this visit.    Past Medical History  Diagnosis Date  . Cardiac dysrhythmia, unspecified   . Anemia, unspecified   . Osteoarthrosis, unspecified whether generalized or localized, unspecified site   . Headache(784.0)   . Morbid obesity   . Backache, unspecified   . HTN (hypertension)   . Hypothyroidism   . CAD (coronary artery disease)     a. 2009 PCI/DES LAD, LCX, RCA, cba OM1;  b. 08/2011 Cath: LM nl, LAD, LCX, RCA - patent stents, OM1 patent, EF 55%,-> Med Rx.  . Gastric ulcer   . DDD (degenerative disc disease)   . GERD (gastroesophageal reflux disease)   . Chronic diastolic CHF (congestive heart failure)     a. 08/2011 Echo: EF 55-65%  . Abnormal chest x-ray with multiple nodules     a. 08/2011 CXR nodular density Right mid to lower chest - f/u CXR 6 wks -> CT if abnl    Past Surgical History  Procedure Laterality Date  . Cesarean section    . Coronary angioplasty with stent placement      mid lad (promus drug eluting stent) 2009. cutting balloon angioplasty first obtuse marginal. three overlapping stents in the RCA- promus DES x3  . Bilateral shoulder rcr    . Abdominal hysterectomy    . Joint replacement      History   Social History  . Marital Status: Married    Spouse Name: N/A    Number of Children: N/A  . Years of Education: N/A   Occupational History  . Not on file.   Social History Main Topics  . Smoking status: Former Smoker -- 0.50 packs/day for 40 years    Types: Cigarettes    Start date: 03/24/1966    Quit date: 04/24/2008  . Smokeless tobacco: Never Used  . Alcohol Use: No  . Drug Use: No  . Sexual Activity: Not on file   Other Topics Concern  . Not on file   Social History Narrative   Married, does not get regular exercise. Pt works as an Charity fundraiser at [employer].       Filed Vitals:   01/31/13 1033  BP: 113/78  Pulse: 100  Height: 5' (1.524 m)  Weight: 195 lb  (  88.451 kg)  SpO2: 95%    PHYSICAL EXAM General: NAD, obese, in a wheelchair Neck: No JVD, no thyromegaly or thyroid nodule.  Lungs: Clear to auscultation bilaterally with normal respiratory effort. CV: Nondisplaced PMI.  Heart regular S1/S2, no S3/S4, II/VI early peaking ejection systolic murmur.  No peripheral edema.  No carotid bruit.  Unable to palpate pedal pulses.  Abdomen: Soft, nontender, no hepatosplenomegaly, no distention.  Neurologic: Alert and oriented x 3.  Psych: Normal affect. Extremities: No clubbing or cyanosis.   ECG: reviewed and available in electronic records.     ASSESSMENT AND PLAN: CAD  Cath in 08/2011 with nonobstructive disease. She continues to have mild episodic chest pain relieved with 2 SL nitro tablets. Given her multiple drug-eluting stents, I will have her continue Plavix long-term. She will also stay on ASA 81, statin, ACEI, and Imdur, all of which I'll refill today.   Hyponatremia  Uncertain etiology. 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 No history of malignancy. Should not use thiazide diuretics in the future. Most recent BMET with normal sodium at [facility] in 12/2012.  HYPERTENSION  BP appears controlled.   AS (aortic stenosis)  Mild by echo and exam.   Hyperlipidemia  She is on atorvastatin 80 mg daily. Transaminases normal in 10/2012. Recheck lipids at next visit.  Shortness of breath/orthopnea Will check a Hgb to see if it has dropped, as her resting HR is elevated as well.  Dispo: f/u 6 months.    Prentice Docker, M.D., F.A.C.C.

## 2013-01-31 NOTE — Patient Instructions (Signed)
Your physician recommends that you schedule a follow-up appointment in: 6 months with Dr. Purvis Sheffield. You should receive a letter in the mail in 4 months. If you do not receive this letter by March 2015 call our office to schedule this appointment.   Your physician recommends that you continue on your current medications as directed. Please refer to the Current Medication list given to you today.  Your physician recommends that you return for lab work in: Today for CBC  You can go to the following locations to get lab work done: [facility] [address] Dr. Lysbeth Galas office in [place] Or [facility].

## 2013-03-21 ENCOUNTER — Inpatient Hospital Stay (HOSPITAL_COMMUNITY)
Admission: EM | Admit: 2013-03-21 | Discharge: 2013-03-23 | DRG: 392 | Disposition: A | Discharge status: Home / Self Care | Payer: Medicare Other | Source: Other Acute Inpatient Hospital | Attending: Internal Medicine | Admitting: Internal Medicine

## 2013-03-21 ENCOUNTER — Encounter (HOSPITAL_COMMUNITY)
Admission: EM | Disposition: A | Discharge status: Home / Self Care | Payer: Self-pay | Source: Other Acute Inpatient Hospital | Attending: Internal Medicine

## 2013-03-21 ENCOUNTER — Encounter (HOSPITAL_COMMUNITY): Payer: Self-pay | Admitting: Physician Assistant

## 2013-03-21 DIAGNOSIS — K219 Gastro-esophageal reflux disease without esophagitis: Secondary | ICD-10-CM | POA: Diagnosis present

## 2013-03-21 DIAGNOSIS — I200 Unstable angina: Secondary | ICD-10-CM

## 2013-03-21 DIAGNOSIS — I679 Cerebrovascular disease, unspecified: Secondary | ICD-10-CM | POA: Diagnosis present

## 2013-03-21 DIAGNOSIS — I214 Non-ST elevation (NSTEMI) myocardial infarction: Secondary | ICD-10-CM

## 2013-03-21 DIAGNOSIS — R918 Other nonspecific abnormal finding of lung field: Secondary | ICD-10-CM | POA: Diagnosis present

## 2013-03-21 DIAGNOSIS — E785 Hyperlipidemia, unspecified: Secondary | ICD-10-CM

## 2013-03-21 DIAGNOSIS — Z87891 Personal history of nicotine dependence: Secondary | ICD-10-CM

## 2013-03-21 DIAGNOSIS — I959 Hypotension, unspecified: Secondary | ICD-10-CM

## 2013-03-21 DIAGNOSIS — Z79899 Other long term (current) drug therapy: Secondary | ICD-10-CM

## 2013-03-21 DIAGNOSIS — Z7982 Long term (current) use of aspirin: Secondary | ICD-10-CM

## 2013-03-21 DIAGNOSIS — IMO0002 Reserved for concepts with insufficient information to code with codable children: Secondary | ICD-10-CM | POA: Diagnosis present

## 2013-03-21 DIAGNOSIS — I359 Nonrheumatic aortic valve disorder, unspecified: Secondary | ICD-10-CM

## 2013-03-21 DIAGNOSIS — I498 Other specified cardiac arrhythmias: Secondary | ICD-10-CM | POA: Diagnosis present

## 2013-03-21 DIAGNOSIS — I2510 Atherosclerotic heart disease of native coronary artery without angina pectoris: Secondary | ICD-10-CM

## 2013-03-21 DIAGNOSIS — E039 Hypothyroidism, unspecified: Secondary | ICD-10-CM | POA: Diagnosis present

## 2013-03-21 DIAGNOSIS — I5032 Chronic diastolic (congestive) heart failure: Secondary | ICD-10-CM | POA: Diagnosis present

## 2013-03-21 DIAGNOSIS — E871 Hypo-osmolality and hyponatremia: Secondary | ICD-10-CM | POA: Diagnosis present

## 2013-03-21 DIAGNOSIS — I350 Nonrheumatic aortic (valve) stenosis: Secondary | ICD-10-CM

## 2013-03-21 DIAGNOSIS — Z6837 Body mass index (BMI) 37.0-37.9, adult: Secondary | ICD-10-CM

## 2013-03-21 DIAGNOSIS — M1990 Unspecified osteoarthritis, unspecified site: Secondary | ICD-10-CM | POA: Diagnosis present

## 2013-03-21 DIAGNOSIS — I059 Rheumatic mitral valve disease, unspecified: Secondary | ICD-10-CM

## 2013-03-21 DIAGNOSIS — I509 Heart failure, unspecified: Secondary | ICD-10-CM | POA: Diagnosis present

## 2013-03-21 DIAGNOSIS — N179 Acute kidney failure, unspecified: Secondary | ICD-10-CM

## 2013-03-21 DIAGNOSIS — I10 Essential (primary) hypertension: Secondary | ICD-10-CM

## 2013-03-21 DIAGNOSIS — D649 Anemia, unspecified: Secondary | ICD-10-CM | POA: Diagnosis present

## 2013-03-21 DIAGNOSIS — Z7902 Long term (current) use of antithrombotics/antiplatelets: Secondary | ICD-10-CM

## 2013-03-21 DIAGNOSIS — Z9861 Coronary angioplasty status: Secondary | ICD-10-CM

## 2013-03-21 DIAGNOSIS — E876 Hypokalemia: Secondary | ICD-10-CM | POA: Diagnosis present

## 2013-03-21 DIAGNOSIS — R079 Chest pain, unspecified: Secondary | ICD-10-CM

## 2013-03-21 DIAGNOSIS — M1710 Unilateral primary osteoarthritis, unspecified knee: Secondary | ICD-10-CM | POA: Diagnosis present

## 2013-03-21 HISTORY — DX: Chronic obstructive pulmonary disease, unspecified: J44.9

## 2013-03-21 HISTORY — PX: LEFT HEART CATHETERIZATION WITH CORONARY ANGIOGRAM: SHX5451

## 2013-03-21 HISTORY — DX: Unspecified viral hepatitis B without hepatic coma: B19.10

## 2013-03-21 HISTORY — DX: Dorsalgia, unspecified: M54.9

## 2013-03-21 HISTORY — DX: Personal history of other medical treatment: Z92.89

## 2013-03-21 HISTORY — DX: Other chronic pain: G89.29

## 2013-03-21 LAB — CBC
HCT: 31.5 % — ABNORMAL LOW (ref 36.0–46.0)
MCH: 31.3 pg (ref 26.0–34.0)
MCHC: 32.4 g/dL (ref 30.0–36.0)
MCV: 96.6 fL (ref 78.0–100.0)
Platelets: 241 10*3/uL (ref 150–400)
RDW: 18.6 % — ABNORMAL HIGH (ref 11.5–15.5)
WBC: 8.2 10*3/uL (ref 4.0–10.5)

## 2013-03-21 LAB — URINALYSIS, ROUTINE W REFLEX MICROSCOPIC
Bilirubin Urine: NEGATIVE
Glucose, UA: NEGATIVE mg/dL
Ketones, ur: NEGATIVE mg/dL
Leukocytes, UA: NEGATIVE
Protein, ur: NEGATIVE mg/dL
Specific Gravity, Urine: 1.031 — ABNORMAL HIGH (ref 1.005–1.030)
Urobilinogen, UA: 0.2 mg/dL (ref 0.0–1.0)

## 2013-03-21 LAB — TROPONIN I
Troponin I: <0.3 ng/mL (ref <0.30)
Troponin I: <0.3 ng/mL (ref <0.30)

## 2013-03-21 LAB — MRSA PCR SCREENING: MRSA by PCR: NEGATIVE

## 2013-03-21 LAB — PROTIME-INR
INR: 1.04 (ref 0.00–1.49)
Prothrombin Time: 13.4 seconds (ref 11.6–15.2)

## 2013-03-21 LAB — D-DIMER, QUANTITATIVE: D-Dimer, Quant: 0.36 ug/mL-FEU (ref 0.00–0.48)

## 2013-03-21 LAB — URINE MICROSCOPIC-ADD ON

## 2013-03-21 LAB — HEPARIN LEVEL (UNFRACTIONATED): Heparin Unfractionated: 0.29 IU/mL — ABNORMAL LOW (ref 0.30–0.70)

## 2013-03-21 LAB — BASIC METABOLIC PANEL
Calcium: 7.4 mg/dL — ABNORMAL LOW (ref 8.4–10.5)
Creatinine, Ser: 0.75 mg/dL (ref 0.50–1.10)
GFR calc Af Amer: >90 mL/min (ref >90)
Glucose, Bld: 120 mg/dL — ABNORMAL HIGH (ref 70–99)

## 2013-03-21 SURGERY — LEFT HEART CATHETERIZATION WITH CORONARY ANGIOGRAM
Anesthesia: LOCAL

## 2013-03-21 MED ORDER — FENTANYL CITRATE 0.05 MG/ML IJ SOLN
INTRAMUSCULAR | Status: AC
  Filled 2013-03-21: qty 2

## 2013-03-21 MED ORDER — ONDANSETRON HCL 4 MG/2ML IJ SOLN
4.0000 mg | Freq: Once | INTRAMUSCULAR | Status: AC
  Administered 2013-03-21: 4 mg via INTRAVENOUS
  Filled 2013-03-21: qty 2

## 2013-03-21 MED ORDER — NITROGLYCERIN 0.4 MG SL SUBL: 0.4000 mg | SUBLINGUAL_TABLET | SUBLINGUAL | Status: DC | PRN

## 2013-03-21 MED ORDER — SODIUM CHLORIDE 0.9 % IJ SOLN: 3.0000 mL | INTRAMUSCULAR | Status: DC | PRN

## 2013-03-21 MED ORDER — MORPHINE SULFATE ER 15 MG PO TBCR
30.0000 mg | EXTENDED_RELEASE_TABLET | Freq: Two times a day (BID) | ORAL | Status: DC
  Administered 2013-03-21 – 2013-03-23 (×4): 30 mg via ORAL
  Filled 2013-03-21 (×4): qty 2

## 2013-03-21 MED ORDER — NITROGLYCERIN 0.2 MG/ML ON CALL CATH LAB
INTRAVENOUS | Status: AC
  Filled 2013-03-21: qty 1

## 2013-03-21 MED ORDER — MIDAZOLAM HCL 2 MG/2ML IJ SOLN
INTRAMUSCULAR | Status: AC
  Filled 2013-03-21: qty 2

## 2013-03-21 MED ORDER — METHOCARBAMOL 750 MG PO TABS
750.0000 mg | ORAL_TABLET | Freq: Four times a day (QID) | ORAL | Status: DC
  Administered 2013-03-21 – 2013-03-23 (×6): 750 mg via ORAL
  Filled 2013-03-21 (×13): qty 1

## 2013-03-21 MED ORDER — ONDANSETRON HCL 4 MG/2ML IJ SOLN: 4.0000 mg | Freq: Four times a day (QID) | INTRAMUSCULAR | Status: DC | PRN

## 2013-03-21 MED ORDER — SODIUM CHLORIDE 0.9 % IJ SOLN: 3.0000 mL | Freq: Two times a day (BID) | INTRAMUSCULAR | Status: DC

## 2013-03-21 MED ORDER — LEVOTHYROXINE SODIUM 50 MCG PO TABS
50.0000 ug | ORAL_TABLET | Freq: Every day | ORAL | Status: DC
  Administered 2013-03-22 – 2013-03-23 (×2): 50 ug via ORAL
  Filled 2013-03-21 (×4): qty 1

## 2013-03-21 MED ORDER — CLOPIDOGREL BISULFATE 75 MG PO TABS
75.0000 mg | ORAL_TABLET | Freq: Every day | ORAL | Status: DC
  Administered 2013-03-21 – 2013-03-23 (×3): 75 mg via ORAL
  Filled 2013-03-21 (×3): qty 1

## 2013-03-21 MED ORDER — POTASSIUM CHLORIDE CRYS ER 20 MEQ PO TBCR
20.0000 meq | EXTENDED_RELEASE_TABLET | Freq: Every day | ORAL | Status: DC
  Administered 2013-03-21 – 2013-03-23 (×3): 20 meq via ORAL
  Filled 2013-03-21 (×3): qty 1

## 2013-03-21 MED ORDER — HEPARIN (PORCINE) IN NACL 2-0.9 UNIT/ML-% IJ SOLN
INTRAMUSCULAR | Status: AC
  Filled 2013-03-21: qty 1000

## 2013-03-21 MED ORDER — SODIUM CHLORIDE 0.9 % IV SOLN: INTRAVENOUS | Status: DC

## 2013-03-21 MED ORDER — FAMOTIDINE 20 MG PO TABS
20.0000 mg | ORAL_TABLET | Freq: Two times a day (BID) | ORAL | Status: DC
  Administered 2013-03-21 – 2013-03-23 (×5): 20 mg via ORAL
  Filled 2013-03-21 (×7): qty 1

## 2013-03-21 MED ORDER — ALPRAZOLAM 0.5 MG PO TABS
0.5000 mg | ORAL_TABLET | Freq: Two times a day (BID) | ORAL | Status: DC
  Administered 2013-03-21 – 2013-03-23 (×5): 0.5 mg via ORAL
  Filled 2013-03-21 (×5): qty 1

## 2013-03-21 MED ORDER — ACETAMINOPHEN 325 MG PO TABS
650.0000 mg | ORAL_TABLET | ORAL | Status: DC | PRN
  Filled 2013-03-21: qty 2

## 2013-03-21 MED ORDER — ISOSORBIDE MONONITRATE ER 60 MG PO TB24
60.0000 mg | ORAL_TABLET | Freq: Every day | ORAL | Status: DC
  Filled 2013-03-21: qty 1

## 2013-03-21 MED ORDER — ATORVASTATIN CALCIUM 80 MG PO TABS
80.0000 mg | ORAL_TABLET | Freq: Every day | ORAL | Status: DC
  Administered 2013-03-22: 19:00:00 80 mg via ORAL
  Filled 2013-03-21 (×3): qty 1

## 2013-03-21 MED ORDER — LIDOCAINE HCL (PF) 1 % IJ SOLN
INTRAMUSCULAR | Status: AC
  Filled 2013-03-21: qty 30

## 2013-03-21 MED ORDER — SODIUM CHLORIDE 0.9 % IV SOLN: 250.0000 mL | INTRAVENOUS | Status: DC | PRN

## 2013-03-21 MED ORDER — HYDROCODONE-ACETAMINOPHEN 5-325 MG PO TABS
1.0000 | ORAL_TABLET | ORAL | Status: DC | PRN
  Administered 2013-03-21 – 2013-03-22 (×3): 1 via ORAL
  Administered 2013-03-23: 2 via ORAL
  Filled 2013-03-21 (×5): qty 1

## 2013-03-21 MED ORDER — DOCUSATE SODIUM 100 MG PO CAPS
100.0000 mg | ORAL_CAPSULE | Freq: Two times a day (BID) | ORAL | Status: DC | PRN
  Administered 2013-03-22: 15:00:00 100 mg via ORAL
  Filled 2013-03-21: qty 1

## 2013-03-21 MED ORDER — ASPIRIN EC 81 MG PO TBEC
81.0000 mg | DELAYED_RELEASE_TABLET | Freq: Every day | ORAL | Status: DC
  Administered 2013-03-21 – 2013-03-23 (×3): 81 mg via ORAL
  Filled 2013-03-21 (×3): qty 1

## 2013-03-21 MED ORDER — HEPARIN (PORCINE) IN NACL 100-0.45 UNIT/ML-% IJ SOLN
1150.0000 [IU]/h | INTRAMUSCULAR | Status: DC
  Administered 2013-03-21: 1000 [IU]/h via INTRAVENOUS
  Filled 2013-03-21 (×2): qty 250

## 2013-03-21 MED ORDER — BENAZEPRIL HCL 40 MG PO TABS: 40.0000 mg | ORAL_TABLET | Freq: Every day | ORAL | Status: DC

## 2013-03-21 MED ORDER — DULOXETINE HCL 30 MG PO CPEP
30.0000 mg | ORAL_CAPSULE | Freq: Every day | ORAL | Status: DC
  Administered 2013-03-21 – 2013-03-23 (×3): 30 mg via ORAL
  Filled 2013-03-21 (×3): qty 1

## 2013-03-21 MED ORDER — HYDROCODONE-ACETAMINOPHEN 5-325 MG PO TABS
ORAL_TABLET | ORAL | Status: AC
  Filled 2013-03-21: qty 1

## 2013-03-21 MED ORDER — HEPARIN (PORCINE) IN NACL 100-0.45 UNIT/ML-% IJ SOLN
1150.0000 [IU]/h | INTRAMUSCULAR | Status: DC
  Administered 2013-03-21: 22:00:00 1150 [IU]/h via INTRAVENOUS
  Filled 2013-03-21: qty 250

## 2013-03-21 MED ORDER — ACETAMINOPHEN 325 MG PO TABS: 650.0000 mg | ORAL_TABLET | ORAL | Status: DC | PRN

## 2013-03-21 MED ORDER — SODIUM CHLORIDE 0.9 % IV SOLN: INTRAVENOUS | Status: AC

## 2013-03-21 MED ORDER — PROMETHAZINE HCL 25 MG PO TABS: 25.0000 mg | ORAL_TABLET | Freq: Four times a day (QID) | ORAL | Status: DC | PRN

## 2013-03-21 MED FILL — Heparin Sodium (Porcine) 100 Unt/ML in Sodium Chloride 0.45%: INTRAMUSCULAR | Qty: 250 | Status: AC

## 2013-03-21 MED FILL — Nitroglycerin IV Soln 200 MCG/ML in D5W: INTRAVENOUS | Qty: 250 | Status: AC
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
## 2013-03-21 NOTE — Progress Notes (Signed)
Utilization review completed.  

## 2013-03-21 NOTE — Progress Notes (Addendum)
Called regarding pt's BP 62 systolic/30s. This was associated with nausea. Nursing gave wide open IV fluids (~250cc total) with improvement in BP up to 80s -> then low 100s. The patient remains mildly tachycardic in low 100s but nausea has resolved. (She was mildly tachycardic and hypotensive even before cath.) Has mild residual CP but not other acute symptoms. Currently comfortable appearing. Cath today showed patent stents with nonobstructive disease. D-dimer WNL. Dr. Shirlee Latch reviewed 2D echo at bedside - no effusion, RV looks OK, aortic root looks OK, mild AS. Unclear clinical picture. CBC at [facility] this AM was normal, normal WBC and H/H. Will send off another just to be sure nothing has changed. Will also add stat BMET, UA, flu screen, blood cx for completeness. Thyroid function also ordered. Continue to monitor. DC benazepril and Imdur from [facility] for now (did not receive today). Continue IV fluids @ 100cc/hr x 12 hrs. Continue to monitor. Stacy Anspaugh PA-C  Addendum: Hold parameters have already been put in place for majority of narcotics. Will also add hold parameter to Xanax and robaxin. Stacy Advani PA-C

## 2013-03-21 NOTE — Progress Notes (Signed)
D Dimer is negative.  Will therefore stop heparin drip.  Awaiting echo results.

## 2013-03-21 NOTE — Progress Notes (Signed)
  Echocardiogram 2D Echocardiogram has been performed.  Georgian Co 03/21/2013, 6:47 PM

## 2013-03-21 NOTE — CV Procedure (Signed)
   Cardiac Catheterization Procedure Note  Name: [name] MRN: [id number] DOB: October 29, 1950  Procedure: Left Heart Cath, Selective Coronary Angiography, LV angiography  Indication:    CAD with previous stents in the RCA, Circ and LAD.   Chest pain suggestive of unstable angina.   Procedural details: The right groin was prepped, draped, and anesthetized with 1% lidocaine. Using modified Seldinger technique, a 5 French sheath was introduced into the right femoral artery. Standard Judkins catheters were used for coronary angiography and left ventriculography. Catheter exchanges were performed over a guidewire. There were no immediate procedural complications. The patient was transferred to the post catheterization recovery area for further monitoring.  Procedural Findings:   Hemodynamics:     AO 107/55    LV 108/5   Coronary angiography:   Coronary dominance: Right  Left mainstem:   Long calcified segment with long 30% stenosis.    Left anterior descending (LAD):   Ostial 30-40% with ostial calcification.  Mid stent patent with mild in stent luminal irregularities.    Left circumflex (LCx):  AV groove proximal moderate calcification.  Long proximal 25% stenosis.  Mid stent widely patent.  MOM small and normal.  PL large and normal.    Right coronary artery (RCA):  Diffuse 25% stenosis.  Proximal/mid stent widely patent with only mild luminal irregularities.  PDA is large and free of disease.    Left ventriculography: Left ventricular systolic function is normal, LVEF is estimated at 65, there is no significant mitral regurgitation   Final Conclusions:  Patent stents with nonobstructive disease.  NL LV function.    Recommendations:  Continue secondary risk reduction.  Discussed with Dr. Johney Frame.  Echo is pending.  I will order a D dimer.  If elevated she will need a VQ scan.  I will restart the heparin later tonight.    Rollene Rotunda 03/21/2013, 1:16 PM

## 2013-03-21 NOTE — H&P (Signed)
History and Physical   Patient ID: [name] MRN: [id number], DOB/AGE: 05-30-1950   Admit date: 03/21/2013 Date of Consult: 03/21/2013  Primary Physician: Toma Deiters, MD Primary Cardiologist: Sharene Skeans MD  HPI: [name] is a 62 y.o. female w/ PMHx s/f CAD (s/p multiple PCIs), HFpEF, chronic angina, pulmonary nodules, HTN, PUD/GERD, obesity, hypothyroidism and recent diverticular bleed (req transfusions 12/2012) who was transferred from [facility] to [facility] today with chest pain.   She followed up w/ Dr. Darl Householder last month. Angiographic/PCI history outlined below. Last cath 08/2011 revealed nonobstructive CAD, patent stents; medically managed. 2D echo- EF 55-60%, mild AS, mild LA dilatation. She had a diverticular bleed in 12/2012 requiring PRBC transfusion and Plavix cessation x 10 days. She had continued to have stable angina requiring NTG SLx 2 for relief. Indefinite DAPT prescribed. She is on high potency atorvastatin 80. Otherwise stable.  She was in her USOH until 3 days ago when she began experiencing intermittent left-sided "vice-like" chest pressure radiating to her back aggravated by exertion, but also waking her from sleep. These episodes were similar in quality to her prior anginal episodes, only more intense and frequent. They did respond to NTG (8/10->6/10) and rest. Associated symptoms include nausea, vomiting and dyspnea. She continues to take ASA/Plavix. No abnormal bleeding. No fevers, chills or sick contacts. No unilateral leg pain or swelling.  At [facility] ED, EKG revealed an old RBBB, sinus tachycardia, no ST changes in the inferolateral leads. Initial TnI did return mildly elevated at 0.07. Pro BNP was mildly elevated at 147. H/H WNL. Cr 1.21 (baseline Cr 0.67). CXR- no active disease. K low at 3.0. Na 132. Ca 7.9. She was started on heparin, IVFs, NTG gtt and transferred to [facility].   She has arrived in stable condition.   Chest pain  currently 6/10.   BP 80/60s on NTG gtt 10 mcg/min.  Problem List: Past Medical History  Diagnosis Date  . Cardiac dysrhythmia, unspecified   . Anemia, unspecified   . Osteoarthrosis, unspecified whether generalized or localized, unspecified site   . Morbid obesity   . HTN (hypertension)   . Hypothyroidism   . CAD (coronary artery disease)     a. 2009 PCI/DES LAD, LCX, RCA, cba OM1;  b. 08/2011 Cath: LM nl, LAD, LCX, RCA - patent stents, OM1 patent, EF 55%,-> Med Rx.  . Gastric ulcer   . DDD (degenerative disc disease)   . GERD (gastroesophageal reflux disease)   . Chronic diastolic CHF (congestive heart failure)     a. 08/2011 Echo: EF 55-65%  . Abnormal chest x-ray with multiple nodules     a. 08/2011 CXR nodular density Right mid to lower chest - f/u CXR 6 wks -> CT if abnl    Past Surgical History  Procedure Laterality Date  . Cesarean section    . Coronary angioplasty with stent placement      mid lad (promus drug eluting stent) 2009. cutting balloon angioplasty first obtuse marginal. three overlapping stents in the RCA- promus DES x3  . Bilateral shoulder rcr    . Abdominal hysterectomy    . Joint replacement       Allergies: No Known Allergies  Home Medications: Prior to Admission medications   Medication Sig Start Date End Date Taking? Authorizing Provider  ALPRAZolam Prudy Feeler) 0.5 MG tablet Take 0.5 mg by mouth 2 (two) times daily.    Historical Provider, MD  aspirin EC 81 MG tablet Take 81 mg  by mouth daily.    Historical Provider, MD  atorvastatin (LIPITOR) 80 MG tablet Take 1 tablet (80 mg total) by mouth daily. 01/31/13   Laqueta Linden, MD  benazepril (LOTENSIN) 40 MG tablet Take 1 tablet (40 mg total) by mouth daily. 01/31/13   Laqueta Linden, MD  clopidogrel (PLAVIX) 75 MG tablet Take 1 tablet (75 mg total) by mouth daily. 01/31/13   Laqueta Linden, MD  docusate sodium (COLACE) 100 MG capsule Take 100 mg by mouth 2 (two) times daily as needed.      Historical Provider, MD  DULoxetine (CYMBALTA) 30 MG capsule Take 30 mg by mouth daily.    Historical Provider, MD  HYDROcodone-acetaminophen (LORTAB) 7.5-500 MG per tablet Take 1 tablet by mouth 4 (four) times daily. For pain    Historical Provider, MD  isosorbide mononitrate (IMDUR) 60 MG 24 hr tablet Take 1 tablet (60 mg total) by mouth daily. 01/31/13   Laqueta Linden, MD  levothyroxine (SYNTHROID, LEVOTHROID) 50 MCG tablet Take 1 tablet (50 mcg total) by mouth daily. 12/20/12   Laurey Morale, MD  meloxicam (MOBIC) 15 MG tablet Take 1 tablet by mouth daily. 07/17/12   Historical Provider, MD  methocarbamol (ROBAXIN) 750 MG tablet Take 750 mg by mouth 4 (four) times daily.    Historical Provider, MD  morphine (MS CONTIN) 30 MG 12 hr tablet Take 30 mg by mouth 2 (two) times daily.    Historical Provider, MD  Multiple Vitamin (MULTIVITAMIN) tablet Take 1 tablet by mouth daily.      Historical Provider, MD  nitroGLYCERIN (NITROSTAT) 0.4 MG SL tablet Place 1 tablet (0.4 mg total) under the tongue every 5 (five) minutes as needed. 10/04/10   Herby Abraham, MD  potassium chloride SA (K-DUR,KLOR-CON) 20 MEQ tablet Take 1 tablet (20 mEq total) by mouth daily. 01/31/13   Laqueta Linden, MD  promethazine (PHENERGAN) 25 MG tablet as needed. 07/08/12   Historical Provider, MD  ranitidine (ZANTAC) 150 MG tablet Take 150 mg by mouth 2 (two) times daily.    Historical Provider, MD    Inpatient Medications:   Prescriptions prior to admission  Medication Sig Dispense Refill  . ALPRAZolam (XANAX) 0.5 MG tablet Take 0.5 mg by mouth 2 (two) times daily.      Marland Kitchen aspirin EC 81 MG tablet Take 81 mg by mouth daily.      Marland Kitchen atorvastatin (LIPITOR) 80 MG tablet Take 1 tablet (80 mg total) by mouth daily.  30 tablet  6  . benazepril (LOTENSIN) 40 MG tablet Take 1 tablet (40 mg total) by mouth daily.  30 tablet  6  . clopidogrel (PLAVIX) 75 MG tablet Take 1 tablet (75 mg total) by mouth daily.  30 tablet  6   . docusate sodium (COLACE) 100 MG capsule Take 100 mg by mouth 2 (two) times daily as needed.       . DULoxetine (CYMBALTA) 30 MG capsule Take 30 mg by mouth daily.      Marland Kitchen HYDROcodone-acetaminophen (LORTAB) 7.5-500 MG per tablet Take 1 tablet by mouth 4 (four) times daily. For pain      . isosorbide mononitrate (IMDUR) 60 MG 24 hr tablet Take 1 tablet (60 mg total) by mouth daily.  30 tablet  6  . levothyroxine (SYNTHROID, LEVOTHROID) 50 MCG tablet Take 1 tablet (50 mcg total) by mouth daily.  30 tablet  3  . meloxicam (MOBIC) 15 MG tablet Take 1 tablet by mouth  daily.      . methocarbamol (ROBAXIN) 750 MG tablet Take 750 mg by mouth 4 (four) times daily.      Marland Kitchen morphine (MS CONTIN) 30 MG 12 hr tablet Take 30 mg by mouth 2 (two) times daily.      . Multiple Vitamin (MULTIVITAMIN) tablet Take 1 tablet by mouth daily.        . nitroGLYCERIN (NITROSTAT) 0.4 MG SL tablet Place 1 tablet (0.4 mg total) under the tongue every 5 (five) minutes as needed.  100 tablet  2  . potassium chloride SA (K-DUR,KLOR-CON) 20 MEQ tablet Take 1 tablet (20 mEq total) by mouth daily.  30 tablet  6  . promethazine (PHENERGAN) 25 MG tablet as needed.      . ranitidine (ZANTAC) 150 MG tablet Take 150 mg by mouth 2 (two) times daily.        Family History  Problem Relation Age of Onset  . Heart attack Other     female <55  . Cancer Other   . Diabetes Other   . Arthritis Other      History   Social History  . Marital Status: Married    Spouse Name: N/A    Number of Children: N/A  . Years of Education: N/A   Occupational History  . Not on file.   Social History Main Topics  . Smoking status: Former Smoker -- 0.50 packs/day for 40 years    Types: Cigarettes    Start date: 03/24/1966    Quit date: 04/24/2008  . Smokeless tobacco: Never Used  . Alcohol Use: No  . Drug Use: No  . Sexual Activity: Not on file   Other Topics Concern  . Not on file   Social History Narrative   Married, does not get  regular exercise. Pt works as an Charity fundraiser at [employer].       Review of Systems: General: negative for chills, fever, night sweats or weight changes.  Cardiovascular: positive for chest pain, dyspnea on exertion, shortness of breath, negative for edema, orthopnea, palpitations, paroxysmal nocturnal dyspnea Dermatological: negative for rash Respiratory: negative for cough or wheezing Urologic: negative for hematuria Abdominal: positive for nausea, vomiting, negative for diarrhea, bright red blood per rectum, melena, or hematemesis Neurologic:  negative for visual changes, syncope, or dizziness All other systems reviewed and are otherwise negative except as noted above.  Physical Exam: Blood pressure 106/86, pulse 114, temperature 98.6 F (37 C), temperature source Oral, resp. rate 19, height 5\' 1"  (1.549 m), weight 88.6 kg (195 lb 5.2 oz), SpO2 100.00%.   General: Well developed, obese-appearing female, in no acute distress. Head: Normocephalic, atraumatic, sclera non-icteric, no xanthomas, nares are without discharge.  Neck: Negative for carotid bruits. JVD not elevated. Lungs: Clear bilaterally to auscultation without wheezes, rales, or rhonchi. Breathing is unlabored. Heart: Regular, tachycardic, II/VI systolic crescendo-decrescendo murmur radiating to carotid bilaterally, with S1 S2. No rubs or gallops appreciated. Abdomen: Soft, non-tender, non-distended with normoactive bowel sounds. No hepatomegaly. No rebound/guarding. No obvious abdominal masses. Msk:  Strength and tone appears normal for age. Extremities: No clubbing, cyanosis or edema.  Distal pedal pulses are 2+ and equal bilaterally. Neuro: Alert and oriented X 3. Moves all extremities spontaneously. Psych:  Responds to questions appropriately with a normal affect.  Labs:  Labs at [place] described above.   Radiology/Studies:   Portable chest x-ray- 2013/03/31  No active disease.   EKG: Sinus tachycardia, RBBB (old),  inferior IVCD, 108 bpm, small Q  waves I, aVL, no ST/T changes V4-V6, I, aVL, II, III, aVF  ASSESSMENT AND PLAN:   1. NSTEMI 2. CAD s/p multiple PCIs 3. AS, mild per 08/2011 4. Sinus tachycardia 5. Dehydration 6. Chronic angina 7. HFpEF 8. Pulmonary nodules 9. HTN 10. PUD/GERD 11. Obesity 12. Hypothyroidism 13. H/o GI bleed. resolved 14. Hypokalemia, hyponatremia, hypocalcemia  The patient has been experiencing intermittent, left-sided "vice-like" chest pressure c/w prior anginal episodes responsive to NTG, increasing in frequency, severity and now associated w/ nausea, vomiting and dyspnea. Objectively, initial TnI did return very mildly elevated at [facility]. EKG indicates an old RBBB, sinus tachycardia, no inferolateral ST changes. She has an extensive cardiac history requiring multiple PCIs/angioplasty. Last ischemic evaluation was 1.5 years ago. She was off Plavix for ~ 10 days in October as well. Chest pain currently 6/10. BP a bit soft limiting antianginals for cp relief. Currently on NTG gtt. Continue to montor. NTG SL PRN for SBP > 100. Hold on BB for now. Will pursue cardiac cath later this AM. Cycle cardiac biomarkers to evaluate trend. Repeat 2D echo to assess status of AV (h/o mild AS, corresponding murmur on exam). Continue heparin. Continue gentle hydration as I suspect she is dehydrated (Cr elevated) from n/v and accounting for sinus tachycardia. Check lipids, Hgb A1C. Check TSH. Alternatively, may represent demand ischemia in the setting of dehydration and tachycardia. Consider D-dimer if cath unrevealing also. She has a history of pulmonary nodules by CXR last year. CT recommended but not performed. PCP follow-up for this as OP.     Signed, R. Hurman Horn, PA-C 03/21/2013, 6:59 AM   I have seen, examined the patient, and reviewed the above assessment and plan.  Changes to above are made where necessary.  The patient presents with symptoms worrisome for unstable  angina.  She has ongoing pain and tachycardia which are worrisome.  I would therefore advise echo and cath at this time.  Risks, benefits, and alternatives to cath were discussed at length with the patient who wishes to proceed.  We will hydrate in the interim.  If cath is unrevealing then other causes for her symptoms will need to be considered.  Co Sign: Hillis Range, MD 03/21/2013 9:31 AM

## 2013-03-21 NOTE — Progress Notes (Signed)
Spoke with Dr. Antoine Poche. Patient may go to a 6 [facility] bed.

## 2013-03-21 NOTE — Interval H&P Note (Signed)
Cath Lab Visit (complete for each Cath Lab visit)  Clinical Evaluation Leading to the Procedure:   ACS: no  Non-ACS:    Anginal Classification: CCS IV  Anti-ischemic medical therapy: Maximal Therapy (2 or more classes of medications)  Non-Invasive Test Results: No non-invasive testing performed  Prior CABG: No previous CABG      History and Physical Interval Note:  03/21/2013 12:52 PM  [name]  has presented today for surgery, with the diagnosis of cp  The various methods of treatment have been discussed with the patient and family. After consideration of risks, benefits and other options for treatment, the patient has consented to  Procedure(s): LEFT HEART CATHETERIZATION WITH CORONARY ANGIOGRAM (N/A) as a surgical intervention .  The patient's history has been reviewed, patient examined, no change in status, stable for surgery.  I have reviewed the patient's chart and labs.  Questions were answered to the patient's satisfaction.     Rollene Rotunda

## 2013-03-22 ENCOUNTER — Encounter (HOSPITAL_COMMUNITY): Payer: Self-pay | Admitting: General Practice

## 2013-03-22 DIAGNOSIS — R079 Chest pain, unspecified: Secondary | ICD-10-CM

## 2013-03-22 LAB — LIPID PANEL
LDL Cholesterol: 51 mg/dL (ref 0–99)
Triglycerides: 94 mg/dL (ref <150)
VLDL: 19 mg/dL (ref 0–40)

## 2013-03-22 LAB — BASIC METABOLIC PANEL
BUN: 5 mg/dL — ABNORMAL LOW (ref 6–23)
Chloride: 104 mEq/L (ref 96–112)
Creatinine, Ser: 0.67 mg/dL (ref 0.50–1.10)
GFR calc Af Amer: >90 mL/min (ref >90)
GFR calc non Af Amer: >90 mL/min (ref >90)
Sodium: 137 mEq/L (ref 137–147)

## 2013-03-22 LAB — MAGNESIUM: Magnesium: 1.6 mg/dL (ref 1.5–2.5)

## 2013-03-22 LAB — INFLUENZA PANEL BY PCR (TYPE A & B): Influenza A By PCR: NEGATIVE

## 2013-03-22 LAB — HEMOGLOBIN A1C: Hgb A1c MFr Bld: 5.2 % (ref <5.7)

## 2013-03-22 NOTE — Progress Notes (Signed)
Received order on admit. Noted progress note comment for pt to ambulate before d/c however pt sts she does not ambulate at home. She is only able to pivot to a chair o/w she would fall. Has sitter. Will sign off. No ed needed as she is negative for MI and cath clean. Ethelda Chick CES, ACSM 9:08 AM 03/22/2013

## 2013-03-22 NOTE — Progress Notes (Signed)
SUBJECTIVE:  Hypotension noted yesterday.  Echo done and reviewed by Dr. Shirlee Latch.  Today the patient says her chest pain is gone.  She reports that she feels OK.     PHYSICAL EXAM Filed Vitals:   03/21/13 1930 03/21/13 2000 03/22/13 0001 03/22/13 0547  BP: 105/48 93/43 97/75  130/65  Pulse: 101 104 98 89  Temp:  98.2 F (36.8 C) 97.6 F (36.4 C) 98.4 F (36.9 C)  TempSrc:  Oral Oral Oral  Resp:  16 16 18   Height:      Weight:   196 lb 3.4 oz (89 kg)   SpO2: 99% 99% 99% 100%   General:  No acute distress Lungs:  Clear Heart:  RRR Abdomen:  Positive bowel sounds, no rebound no guarding Extremities:  Right groin without hematoma or echymosis.   LABS: Lab Results  Component Value Date   TROPONINI <0.30 03/21/2013   Results for orders placed during the hospital encounter of 03/21/13 (from the past 24 hour(s))  TROPONIN I     Status: None   Collection Time    03/21/13  8:00 AM      Result Value Range   Troponin I <0.30  <0.30 ng/mL  PROTIME-INR     Status: None   Collection Time    03/21/13  8:30 AM      Result Value Range   Prothrombin Time 13.4  11.6 - 15.2 seconds   INR 1.04  0.00 - 1.49  HEPARIN LEVEL (UNFRACTIONATED)     Status: Abnormal   Collection Time    03/21/13  8:30 AM      Result Value Range   Heparin Unfractionated 0.29 (*) 0.30 - 0.70 IU/mL  POCT ACTIVATED CLOTTING TIME     Status: None   Collection Time    03/21/13  1:19 PM      Result Value Range   Activated Clotting Time 138    D-DIMER, QUANTITATIVE     Status: None   Collection Time    03/21/13  1:47 PM      Result Value Range   D-Dimer, Quant 0.36  0.00 - 0.48 ug/mL-FEU  TROPONIN I     Status: None   Collection Time    03/21/13  7:55 PM      Result Value Range   Troponin I <0.30  <0.30 ng/mL  BASIC METABOLIC PANEL     Status: Abnormal   Collection Time    03/21/13  7:55 PM      Result Value Range   Sodium 132 (*) 135 - 145 mEq/L   Potassium 3.7  3.5 - 5.1 mEq/L   Chloride 101  96 -  112 mEq/L   CO2 18 (*) 19 - 32 mEq/L   Glucose, Bld 120 (*) 70 - 99 mg/dL   BUN 8  6 - 23 mg/dL   Creatinine, Ser 7.82  0.50 - 1.10 mg/dL   Calcium 7.4 (*) 8.4 - 10.5 mg/dL   GFR calc non Af Amer >90  >90 mL/min   GFR calc Af Amer >90  >90 mL/min  CBC     Status: Abnormal   Collection Time    03/21/13  7:55 PM      Result Value Range   WBC 8.2  4.0 - 10.5 K/uL   RBC 3.26 (*) 3.87 - 5.11 MIL/uL   Hemoglobin 10.2 (*) 12.0 - 15.0 g/dL   HCT 95.6 (*) 21.3 - 08.6 %   MCV 96.6  78.0 - 100.0  fL   MCH 31.3  26.0 - 34.0 pg   MCHC 32.4  30.0 - 36.0 g/dL   RDW 16.1 (*) 09.6 - 04.5 %   Platelets 241  150 - 400 K/uL  TSH     Status: None   Collection Time    03/21/13  7:55 PM      Result Value Range   TSH 1.127  0.350 - 4.500 uIU/mL  T4, FREE     Status: None   Collection Time    03/21/13  7:55 PM      Result Value Range   Free T4 1.35  0.80 - 1.80 ng/dL  URINALYSIS, ROUTINE W REFLEX MICROSCOPIC     Status: Abnormal   Collection Time    03/21/13  8:03 PM      Result Value Range   Color, Urine YELLOW  YELLOW   APPearance CLEAR  CLEAR   Specific Gravity, Urine 1.031 (*) 1.005 - 1.030   pH 6.0  5.0 - 8.0   Glucose, UA NEGATIVE  NEGATIVE mg/dL   Hgb urine dipstick MODERATE (*) NEGATIVE   Bilirubin Urine NEGATIVE  NEGATIVE   Ketones, ur NEGATIVE  NEGATIVE mg/dL   Protein, ur NEGATIVE  NEGATIVE mg/dL   Urobilinogen, UA 0.2  0.0 - 1.0 mg/dL   Nitrite NEGATIVE  NEGATIVE   Leukocytes, UA NEGATIVE  NEGATIVE  URINE MICROSCOPIC-ADD ON     Status: None   Collection Time    03/21/13  8:03 PM      Result Value Range   Squamous Epithelial / LPF RARE  RARE   RBC / HPF 11-20  <3 RBC/hpf   Bacteria, UA RARE  RARE  LIPID PANEL     Status: None   Collection Time    03/22/13  6:10 AM      Result Value Range   Cholesterol 126  0 - 200 mg/dL   Triglycerides 94  <409 mg/dL   HDL 56  >81 mg/dL   Total CHOL/HDL Ratio 2.3     VLDL 19  0 - 40 mg/dL   LDL Cholesterol 51  0 - 99 mg/dL  MAGNESIUM      Status: None   Collection Time    03/22/13  6:10 AM      Result Value Range   Magnesium 1.6  1.5 - 2.5 mg/dL  BASIC METABOLIC PANEL     Status: Abnormal   Collection Time    03/22/13  6:10 AM      Result Value Range   Sodium 137  137 - 147 mEq/L   Potassium 3.8  3.7 - 5.3 mEq/L   Chloride 104  96 - 112 mEq/L   CO2 22  19 - 32 mEq/L   Glucose, Bld 98  70 - 99 mg/dL   BUN 5 (*) 6 - 23 mg/dL   Creatinine, Ser 1.91  0.50 - 1.10 mg/dL   Calcium 7.5 (*) 8.4 - 10.5 mg/dL   GFR calc non Af Amer >90  >90 mL/min   GFR calc Af Amer >90  >90 mL/min    Intake/Output Summary (Last 24 hours) at 03/22/13 0754 Last data filed at 03/22/13 0615  Gross per 24 hour  Intake 1429.8 ml  Output   2350 ml  Net -920.2 ml     ASSESSMENT AND PLAN:  HYPOTENSION:  BP is better but somewhat labile.  Acute event of last night of unclear etiology.  Meds on hold as documented in note last night.  I will continue to hold these meds today.  She will ambulate.  Possibly home in the AM.   CHEST PAIN:  Patent coronary stents as reported.  D dimer was normal.  Low suspicion for pulmonary embolism.  No further in patient work up is planned as the pain has resolved.      Fayrene Fearing [facility] 03/22/2013 7:54 AM

## 2013-03-23 ENCOUNTER — Encounter (HOSPITAL_COMMUNITY): Payer: Self-pay | Admitting: Nurse Practitioner

## 2013-03-23 DIAGNOSIS — I959 Hypotension, unspecified: Secondary | ICD-10-CM

## 2013-03-23 DIAGNOSIS — E785 Hyperlipidemia, unspecified: Secondary | ICD-10-CM

## 2013-03-23 DIAGNOSIS — I200 Unstable angina: Secondary | ICD-10-CM

## 2013-03-23 LAB — CBC
Hemoglobin: 8.5 g/dL — ABNORMAL LOW (ref 12.0–15.0)
MCH: 30.8 pg (ref 26.0–34.0)
MCHC: 31.8 g/dL (ref 30.0–36.0)
RBC: 2.76 MIL/uL — ABNORMAL LOW (ref 3.87–5.11)
WBC: 5 10*3/uL (ref 4.0–10.5)

## 2013-03-23 MED ORDER — HEPARIN SOD (PORK) LOCK FLUSH 100 UNIT/ML IV SOLN
500.0000 [IU] | INTRAVENOUS | Status: AC | PRN
  Administered 2013-03-23: 11:00:00 500 [IU]

## 2013-03-23 MED ORDER — ISOSORBIDE MONONITRATE ER 60 MG PO TB24
60.0000 mg | ORAL_TABLET | Freq: Every day | ORAL | Status: DC
  Administered 2013-03-23: 60 mg via ORAL
  Filled 2013-03-23: qty 1

## 2013-03-23 NOTE — Progress Notes (Signed)
Patient Name: [name] Date of Encounter: 03/23/2013   Principal Problem:   Unstable angina Active Problems:   CAD   Hypotension   Morbid obesity   HYPERTENSION   HYPOTHYROIDISM   UNSPECIFIED ANEMIA   CEREBROVASCULAR DISEASE   KNEE, ARTHRITIS, DEGEN./OSTEO   GERD (gastroesophageal reflux disease)   Hyperlipidemia   SUBJECTIVE  Some chest pain last night while ambulating in room.  Brief and did not require intervention.  BP better throughout the day yesterday.  CURRENT MEDS . ALPRAZolam  0.5 mg Oral BID  . aspirin EC  81 mg Oral Daily  . atorvastatin  80 mg Oral q1800  . clopidogrel  75 mg Oral Daily  . DULoxetine  30 mg Oral Daily  . famotidine  20 mg Oral BID  . levothyroxine  50 mcg Oral QAC breakfast  . methocarbamol  750 mg Oral QID  . morphine  30 mg Oral BID  . potassium chloride SA  20 mEq Oral Daily  . sodium chloride  3 mL Intravenous Q12H   OBJECTIVE  Filed Vitals:   03/22/13 2129 03/23/13 0021 03/23/13 0500 03/23/13 0610  BP: 130/72 123/65  131/50  Pulse: 98 89  85  Temp: 98.4 F (36.9 C) 98.5 F (36.9 C)    TempSrc: Oral Oral    Resp: 16 18    Height:      Weight:  197 lb 8.5 oz (89.6 kg) 197 lb 8.5 oz (89.6 kg)   SpO2: 98% 98%  98%    Intake/Output Summary (Last 24 hours) at 03/23/13 0653 Last data filed at 03/22/13 2146  Gross per 24 hour  Intake    340 ml  Output   1025 ml  Net   -685 ml   Filed Weights   03/22/13 0001 03/23/13 0021 03/23/13 0500  Weight: 196 lb 3.4 oz (89 kg) 197 lb 8.5 oz (89.6 kg) 197 lb 8.5 oz (89.6 kg)   PHYSICAL EXAM  General: Pleasant, NAD. Neuro: Alert and oriented X 3. Moves all extremities spontaneously. Psych: Normal affect. HEENT:  Normal  Neck: Supple without JVD.  bilat bruits vs radiated murmur. Lungs:  Resp regular and unlabored, CTA. Heart: RRR no s3, s4, 2/6 SEM throughout - loudest rusb. Abdomen: Soft, non-tender, non-distended, BS + x 4.  Extremities: No clubbing, cyanosis or edema.  DP/PT/Radials 2+ and equal bilaterally.  R groin w/o bleeding, bruit, hematoma.  Accessory Clinical Findings  CBC  Recent Labs  03/21/13 1955 03/23/13 0338  WBC 8.2 5.0  HGB 10.2* 8.5*  HCT 31.5* 26.7*  MCV 96.6 96.7  PLT 241 199   Basic Metabolic Panel  Recent Labs  03/21/13 1955 03/22/13 0610  NA 132* 137  K 3.7 3.8  CL 101 104  CO2 18* 22  GLUCOSE 120* 98  BUN 8 5*  CREATININE 0.75 0.67  CALCIUM 7.4* 7.5*  MG  --  1.6   Cardiac Enzymes  Recent Labs  03/21/13 0800 03/21/13 1955  TROPONINI <0.30 <0.30   D-Dimer  Recent Labs  03/21/13 1347  DDIMER 0.36   Hemoglobin A1C  Recent Labs  03/22/13 0610  HGBA1C 5.2   Fasting Lipid Panel  Recent Labs  03/22/13 0610  CHOL 126  HDL 56  LDLCALC 51  TRIG 94  CHOLHDL 2.3   Thyroid Function Tests  Recent Labs  03/21/13 1955  TSH 1.127   TELE  rsr  ECG  Rsr, 86, rbbb, no acute st/t changes.  Radiology/Studies  No results  found.  ASSESSMENT AND PLAN  1.  USA/CAD:  S/p cath revealing patent stents and nl LV fxn.  Meds held yesterday 2/2 hypotension following cath on 12/29.  She did have some c/p while up in her room last night.  BP better.  Plan to resume long-acting nitrate.  Cont asa, statin.  She also takes benazepril at home - will like defer resumption to outpt setting.  2.  Hypotension:  Resolved.  Resume nitrate as above.  3.  HL:  Cont statin.  LDL 51.  4.  Gerd:  In absence of obj evidence of ischemia/obstructive CAD/neg d dimer, add PPI.  5.  Normocytic Anemia:  H/H down to 8.5/26.7.  No evidence of bleeding.  Groin looks good.  Will need f/u cbc in 1 wk as an outpt.  6. Dispo:  Resume imdur.  D/c foley.  Ambulate.  Plan d/c this AM.  Signed, Nicolasa Ducking NP  History and all data above reviewed.  Patient examined.  I agree with the findings as above.  She had a "twinge" of pain last night not like her presenting complaints.   The patient exam reveals COR:RRR  ,  Lungs:  Clear  ,  Abd: Positive bowel sounds, no rebound no guarding, Ext No bruising or pulsatile mass.  No tenderness or bruit at the right femoral access site  .  All available labs, radiology testing, previous records reviewed. Agree with documented assessment and plan. OK to go home today.  She was guaiac negative in the ER.  Check a CBC on Friday.  Send results to me.   Rollene Rotunda  7:43 AM  03/23/2013

## 2013-03-23 NOTE — Discharge Summary (Signed)
Discharge Summary   Patient ID: [name],  MRN: [id number], DOB/AGE: 60-03-1950 60 y.o.  Admit date: 03/21/2013 Discharge date: 03/23/2013  Primary Care Provider: Lia Hopping A Primary Cardiologist: Junius Argyle, MD   Discharge Diagnoses Principal Problem:   Unstable angina  **S/P catheterization this admission revealing nonobstructive CAD.  **Normal D-dimer this admission.  Active Problems:   CAD   Hypotension   Morbid obesity   HYPERTENSION   HYPOTHYROIDISM   UNSPECIFIED ANEMIA   CEREBROVASCULAR DISEASE   KNEE, ARTHRITIS, DEGEN./OSTEO   GERD (gastroesophageal reflux disease)   Hyperlipidemia  Allergies No Known Allergies  Procedures  Cardiac Catheterization 12.29.2014  Procedural Findings:              Hemodynamics:                                      AO 107/55                                     LV 108/5              Coronary angiography:    Coronary dominance: Right  Left mainstem:   Long calcified segment with long 30% stenosis.    Left anterior descending (LAD):   Ostial 30-40% with ostial calcification.  Mid stent patent with mild in stent luminal irregularities.    Left circumflex (LCx):  AV groove proximal moderate calcification.  Long proximal 25% stenosis.  Mid stent widely patent.  MOM small and normal.  PL large and normal.    Right coronary artery (RCA):  Diffuse 25% stenosis.  Proximal/mid stent widely patent with only mild luminal irregularities.  PDA is large and free of disease.    Left ventriculography: Left ventricular systolic function is normal, LVEF is estimated at 65, there is no significant mitral regurgitation  _____________  2D Echocardiogram 12.29.2014  Study Conclusions  - Left ventricle: Poor image quality Septal thickness not   measured The cavity size was normal. Systolic function was   normal. The estimated ejection fraction was in the range   of 60% to 65%. - Aortic valve: Poorly visualized Small gradient  across   valve but appears to open well. There is some SAM and   subvalvular gradient that seems   more significant. Suggest TEE or f/u echo with beta   blocker Rx - Mitral valve: Mild regurgitation. - Left atrium: The atrium was mildly dilated. - Atrial septum: No defect or patent foramen ovale was   identified. _____________   History of Present Illness  62 year old female with prior history of coronary artery disease status post multiple percutaneous interventions involving all 3 of her major epicardial coronary arteries. She also has a history of heart failure with preserved LV function and chronic angina. She was in her usual state of health until approximately 3 days prior to admission when she began to experience intermittent left-sided viselike chest pressure radiating through to her back which was aggravated by exertion but also occurred during periods of sleep. These symptoms were similar to prior angina and on the day of admission did not respond to sublingual nitroglycerin. She presented to [facility] on December 28 where she was found to have a mild elevation of troponin at 0.07. She was placed on IV nitroglycerin and heparin with pain relief, and transferred  to [facility] cone for further evaluation.  Hospital Course  Subsequent troponins remained within normal limits. She continued to have intermittent chest discomfort and as result diagnostic cardiac catheterization was undertaken on December 29. This showed multivessel nonobstructive coronary artery disease with patent stents and normal LV function. Continue medical therapy was recommended. A d-dimer was evaluated and was within normal limits. Post catheterization, patient developed nausea and hypotension with systolic blood pressures in the 60s. She was treated with a normal saline bolus with recovery of blood pressure. 2-D echocardiography was performed and showed no evidence of effusion with normal right ventricular and left  ventricular function. Etiology of brief period of hypotension was unclear. Antihypertensive therapy was placed on hold.  [name] had no further hypotension on December 30 or this morning. She did have mild fleeting chest pressure on the night of December 30 and we have resumed her long-acting nitrate therapy. She will be discharged home today in good condition and for the time being, we are holding her previous dose of ACE inhibitor therapy. It is notable that she has chronic normocytic anemia and that her hemoglobin and hematocrit dropped from 10.2 and 31.5 to 8.5 and 26.7 respectively. She has no obvious source of bleeding in her stool was guaiac-negative. She has been provided a prescription for a followup CBC on Friday, January 2.   Discharge Vitals Blood pressure 131/50, pulse 85, temperature 98.5 F (36.9 C), temperature source Oral, resp. rate 20, height 5\' 1"  (1.549 m), weight 197 lb 8.5 oz (89.6 kg), SpO2 98.00%.  Filed Weights   03/22/13 0001 03/23/13 0021 03/23/13 0500  Weight: 196 lb 3.4 oz (89 kg) 197 lb 8.5 oz (89.6 kg) 197 lb 8.5 oz (89.6 kg)    Labs  CBC  Recent Labs  03/21/13 1955 03/23/13 0338  WBC 8.2 5.0  HGB 10.2* 8.5*  HCT 31.5* 26.7*  MCV 96.6 96.7  PLT 241 199   Basic Metabolic Panel  Recent Labs  03/21/13 1955 03/22/13 0610  NA 132* 137  K 3.7 3.8  CL 101 104  CO2 18* 22  GLUCOSE 120* 98  BUN 8 5*  CREATININE 0.75 0.67  CALCIUM 7.4* 7.5*  MG  --  1.6   Cardiac Enzymes  Recent Labs  03/21/13 0800 03/21/13 1955  TROPONINI <0.30 <0.30   D-Dimer  Recent Labs  03/21/13 1347  DDIMER 0.36   Hemoglobin A1C  Recent Labs  03/22/13 0610  HGBA1C 5.2   Fasting Lipid Panel  Recent Labs  03/22/13 0610  CHOL 126  HDL 56  LDLCALC 51  TRIG 94  CHOLHDL 2.3   Thyroid Function Tests  Recent Labs  03/21/13 1955  TSH 1.127   Disposition  Pt is being discharged home today in good condition.  Follow-up Plans &  Appointments  Follow-up Information   Follow up with Laqueta Linden, MD On 04/12/2013. (11:00 AM)    Specialty:  Cardiology   Contact information:   [address] [place] [address] [phone]       Follow up with Complete Blood Count On 03/25/2013. (to follow up anemia.)    Contact information:   [facility]      Follow up with Toma Deiters, MD On 03/23/2013. (1-2 wks)    Specialty:  Internal Medicine   Contact information:   [address] [place] [address] [phone]      Discharge Medications    Medication List  STOP taking these medications       benazepril 40 MG tablet  Commonly known as:  LOTENSIN      TAKE these medications       albuterol (2.5 MG/3ML) 0.083% nebulizer solution  Commonly known as:  PROVENTIL  Take 2.5 mg by nebulization every 6 (six) hours as needed for wheezing or shortness of breath.     ALPRAZolam 0.5 MG tablet  Commonly known as:  XANAX  Take 0.5 mg by mouth 2 (two) times daily.     aspirin EC 81 MG tablet  Take 81 mg by mouth daily.     atorvastatin 80 MG tablet  Commonly known as:  LIPITOR  Take 1 tablet (80 mg total) by mouth daily.     buPROPion 150 MG 24 hr tablet  Commonly known as:  WELLBUTRIN XL  Take 150 mg by mouth daily.     clopidogrel 75 MG tablet  Commonly known as:  PLAVIX  Take 1 tablet (75 mg total) by mouth daily.     docusate sodium 100 MG capsule  Commonly known as:  COLACE  Take 100 mg by mouth daily as needed for mild constipation.     DULoxetine 30 MG capsule  Commonly known as:  CYMBALTA  Take 30 mg by mouth 2 (two) times daily.     HYDROcodone-acetaminophen 7.5-500 MG per tablet  Commonly known as:  LORTAB  Take 1 tablet by mouth 4 (four) times daily. For pain     isosorbide mononitrate 60 MG 24 hr tablet  Commonly known as:  IMDUR  Take 1 tablet (60 mg total) by mouth daily.     levothyroxine 50 MCG tablet  Commonly known as:  SYNTHROID,  LEVOTHROID  Take 1 tablet (50 mcg total) by mouth daily.     meloxicam 7.5 MG tablet  Commonly known as:  MOBIC  Take 7.5 mg by mouth daily.     methocarbamol 750 MG tablet  Commonly known as:  ROBAXIN  Take 750 mg by mouth 4 (four) times daily.     MS CONTIN 30 MG 12 hr tablet  Generic drug:  morphine  Take 30 mg by mouth 2 (two) times daily.     nitroGLYCERIN 0.4 MG SL tablet  Commonly known as:  NITROSTAT  Place 0.4 mg under the tongue every 5 (five) minutes as needed for chest pain.     potassium chloride 10 MEQ tablet  Commonly known as:  K-DUR  Take 10 mEq by mouth daily.     promethazine 25 MG tablet  Commonly known as:  PHENERGAN  Take 25 mg by mouth every 6 (six) hours as needed for nausea or vomiting.     ranitidine 150 MG tablet  Commonly known as:  ZANTAC  Take 150 mg by mouth 2 (two) times daily.     traZODone 50 MG tablet  Commonly known as:  DESYREL  Take 50 mg by mouth at bedtime.       Outstanding Labs/Studies  CBC on 03/25/2013.  Duration of Discharge Encounter   Greater than 30 minutes including physician time.  Signed, Nicolasa Ducking NP 03/23/2013, 8:44 AM   Patient seen and examined.  Plan as discussed in my rounding note for today and outlined above. Fayrene Fearing [facility]  03/23/2013  12:13 PM

## 2013-03-28 LAB — CULTURE, BLOOD (ROUTINE X 2)
Culture: NO GROWTH
Culture: NO GROWTH

## 2013-04-12 ENCOUNTER — Ambulatory Visit: Payer: Medicare Other | Admitting: Cardiovascular Disease

## 2013-04-20 ENCOUNTER — Other Ambulatory Visit: Payer: Self-pay | Admitting: Cardiology

## 2013-04-20 MED ORDER — LEVOTHYROXINE SODIUM 50 MCG PO TABS: 50.0000 ug | ORAL_TABLET | Freq: Every day | ORAL | Status: DC

## 2013-05-05 ENCOUNTER — Other Ambulatory Visit: Payer: Self-pay | Admitting: Cardiology

## 2013-05-05 ENCOUNTER — Telehealth: Payer: Self-pay | Admitting: Cardiology

## 2013-05-05 ENCOUNTER — Encounter: Payer: Self-pay | Admitting: Cardiovascular Disease

## 2013-05-05 ENCOUNTER — Ambulatory Visit (INDEPENDENT_AMBULATORY_CARE_PROVIDER_SITE_OTHER): Payer: Medicare Other | Admitting: Cardiovascular Disease

## 2013-05-05 VITALS — BP 162/97 | HR 101 | Ht 60.0 in | Wt 208.0 lb

## 2013-05-05 DIAGNOSIS — I359 Nonrheumatic aortic valve disorder, unspecified: Secondary | ICD-10-CM

## 2013-05-05 DIAGNOSIS — Z955 Presence of coronary angioplasty implant and graft: Secondary | ICD-10-CM

## 2013-05-05 DIAGNOSIS — I10 Essential (primary) hypertension: Secondary | ICD-10-CM

## 2013-05-05 DIAGNOSIS — Z9861 Coronary angioplasty status: Secondary | ICD-10-CM

## 2013-05-05 DIAGNOSIS — D649 Anemia, unspecified: Secondary | ICD-10-CM

## 2013-05-05 DIAGNOSIS — E785 Hyperlipidemia, unspecified: Secondary | ICD-10-CM

## 2013-05-05 DIAGNOSIS — Z79899 Other long term (current) drug therapy: Secondary | ICD-10-CM

## 2013-05-05 DIAGNOSIS — Z9289 Personal history of other medical treatment: Secondary | ICD-10-CM

## 2013-05-05 DIAGNOSIS — I2510 Atherosclerotic heart disease of native coronary artery without angina pectoris: Secondary | ICD-10-CM

## 2013-05-05 DIAGNOSIS — Z87898 Personal history of other specified conditions: Secondary | ICD-10-CM

## 2013-05-05 DIAGNOSIS — I350 Nonrheumatic aortic (valve) stenosis: Secondary | ICD-10-CM

## 2013-05-05 MED ORDER — METOPROLOL SUCCINATE ER 25 MG PO TB24: 25.0000 mg | ORAL_TABLET | Freq: Every day | ORAL | Status: DC

## 2013-05-05 NOTE — Patient Instructions (Signed)
Your physician recommends that you schedule a follow-up appointment in: 6 months with Dr. Purvis SheffieldKoneswaran. You should receive a letter in the mail in 4 months. If you do not receive this letter by June 2015 call our office to schedule this appointment.   Your physician has recommended you make the following change in your medication:  Start: Metoprolol XL (Toprol XL) 25 MG take 1 tablet by mouth once daily.  Continue all other medications the same.

## 2013-05-05 NOTE — Telephone Encounter (Signed)
Opened in error

## 2013-05-05 NOTE — Progress Notes (Signed)
Patient ID: [name], female   DOB: 09-18-1950, 63 y.o.   MRN: [id number]      SUBJECTIVE: [name] is a 63 yr old woman with a history of CAD and PCI to multiple vessels, who presents for cardiology followup. She was recently hospitalized for what was deemed to be unstable angina. She underwent coronary angiography which revealed nonobstructive disease with patent stents. A d-dimer was checked and found to be normal. An echocardiogram showed normal left ventricular systolic function, EF 60-65%, with some mild systolic anterior motion of the mitral leaflet (as interpreted by Dr. Eden Emms) vs mild aortic stenosis (as reportedly interpreted by Dr. Shirlee Latch at bedside). She also had some hypotension of unclear etiology. For this reason, benazepril was held at discharge, but was resumed by her PCP as she had been markedly hypertensive. She was discharged on Imdur 60 mg daily which is the same dose she was taking prior to hospitalization. Today her blood pressure is 162/97. She feels well and denies chest pain, shortness of breath, leg swelling, palpitations, orthopnea and paroxysmal nocturnal dyspnea      No Known Allergies  Current Outpatient Prescriptions  Medication Sig Dispense Refill  . albuterol (PROVENTIL) (2.5 MG/3ML) 0.083% nebulizer solution Take 2.5 mg by nebulization every 6 (six) hours as needed for wheezing or shortness of breath.      . ALPRAZolam (XANAX) 0.5 MG tablet Take 0.5 mg by mouth 2 (two) times daily.      Marland Kitchen aspirin EC 81 MG tablet Take 81 mg by mouth daily.      Marland Kitchen atorvastatin (LIPITOR) 80 MG tablet Take 1 tablet (80 mg total) by mouth daily.  30 tablet  6  . benazepril (LOTENSIN) 40 MG tablet Take 1 tablet by mouth daily.      Marland Kitchen buPROPion (WELLBUTRIN XL) 150 MG 24 hr tablet Take 150 mg by mouth daily.       . clopidogrel (PLAVIX) 75 MG tablet Take 1 tablet (75 mg total) by mouth daily.  30 tablet  6  . docusate sodium (COLACE) 100 MG capsule Take 100 mg by mouth daily  as needed for mild constipation.      . DULoxetine (CYMBALTA) 30 MG capsule Take 30 mg by mouth 2 (two) times daily.      Marland Kitchen HYDROcodone-acetaminophen (LORTAB) 7.5-500 MG per tablet Take 1 tablet by mouth 4 (four) times daily. For pain      . isosorbide mononitrate (IMDUR) 60 MG 24 hr tablet Take 1 tablet (60 mg total) by mouth daily.  30 tablet  6  . levothyroxine (SYNTHROID, LEVOTHROID) 50 MCG tablet Take 1 tablet (50 mcg total) by mouth daily.  30 tablet  3  . meloxicam (MOBIC) 7.5 MG tablet Take 7.5 mg by mouth daily.      . methocarbamol (ROBAXIN) 750 MG tablet Take 750 mg by mouth 4 (four) times daily.      Marland Kitchen morphine (MS CONTIN) 30 MG 12 hr tablet Take 30 mg by mouth 2 (two) times daily.      . nitroGLYCERIN (NITROSTAT) 0.4 MG SL tablet Place 0.4 mg under the tongue every 5 (five) minutes as needed for chest pain.      . potassium chloride (K-DUR) 10 MEQ tablet Take 10 mEq by mouth daily.      . promethazine (PHENERGAN) 25 MG tablet Take 25 mg by mouth every 6 (six) hours as needed for nausea or vomiting.      . ranitidine (ZANTAC) 150 MG tablet  Take 150 mg by mouth 2 (two) times daily.      . traZODone (DESYREL) 50 MG tablet Take 50 mg by mouth at bedtime.        No current facility-administered medications for this visit.    Past Medical History  Diagnosis Date  . Cardiac dysrhythmia, unspecified   . Anemia, unspecified   . Morbid obesity   . HTN (hypertension)   . Hypothyroidism   . CAD (coronary artery disease)     a. 2009 PCI/DES LAD, LCX, RCA, cba OM1;  b. 08/2011 Cath: LM nl, LAD, LCX, RCA - patent stents, OM1 patent, EF 55%,-> Med Rx;  c. 02/2013 Cath: LM 30, LAD 30-40ost, patent mid stent, LCX 25p, patent mid stent, RCA 25diff, patent prox/mid stent, EF 65%.  . Gastric ulcer   . GERD (gastroesophageal reflux disease)   . Chronic diastolic CHF (congestive heart failure)     a. 08/2011 Echo: EF 55-65%;  b. 02/2013 Echo: EF 60-65%, some SAM of AoV w subvalvular gradient that  seems more significant, rec tee or f/u echo w/ BB, mild MR, mildly dil LA  . Abnormal chest x-ray with multiple nodules     a. 08/2011 CXR nodular density Right mid to lower chest - f/u CXR 6 wks -> CT if abnl  . High cholesterol   . COPD (chronic obstructive pulmonary disease)   . History of blood transfusion 12/2012    "2 units for lower GIB"  . Acute lower GI bleeding 12/2012    "from diverticula"  . Serum hepatitis 1970  . ZOXWRUEA(540.9)     "monthly" (03/22/2013)  . Osteoarthrosis, unspecified whether generalized or localized, unspecified site   . DDD (degenerative disc disease)   . Arthritis     "knees"  . Chronic back pain 2002    "broke my back"     Past Surgical History  Procedure Laterality Date  . Cesarean section  1971  . Shoulder open rotator cuff repair Left 2010  . Joint replacement    . Coronary angioplasty with stent placement      mid lad (promus drug eluting stent) 2009. cutting balloon angioplasty first obtuse marginal. three overlapping stents in the RCA- promus DES x3  . Cardiac catheterization  08/19/2012  . Total knee arthroplasty Left 2011  . Tubal ligation  1985  . Vaginal hysterectomy  1988    "cut out 3/4th of my bladder accidentally" (03/22/2013)  . Dilation and curettage of uterus  1970    "6 month fetus; abruptio placenta"    History   Social History  . Marital Status: Married    Spouse Name: N/A    Number of Children: N/A  . Years of Education: N/A   Occupational History  . Not on file.   Social History Main Topics  . Smoking status: Former Smoker -- 0.50 packs/day for 40 years    Types: Cigarettes    Start date: 03/24/1966    Quit date: 04/24/2008  . Smokeless tobacco: Never Used  . Alcohol Use: No  . Drug Use: No  . Sexual Activity: Not Currently   Other Topics Concern  . Not on file   Social History Narrative   Married, does not get regular exercise. Pt works as an Charity fundraiser at [employer].       Filed Vitals:   05/05/13  1055  BP: 162/97  Pulse: 101  Height: 5' (1.524 m)  Weight: 208 lb (94.348 kg)  SpO2: 96%  PHYSICAL EXAM General: NAD Neck: No JVD, no thyromegaly or thyroid nodule.  Lungs: Clear to auscultation bilaterally with normal respiratory effort. CV: Nondisplaced PMI.  Heart regular S1/S2, no S3/S4, II/VI pansystolic murmur heard throughout precordium.  No peripheral edema.  No carotid bruit.  Normal pedal pulses.  Abdomen: Soft, nontender, no hepatosplenomegaly, no distention.  Neurologic: Alert and oriented x 3.  Psych: Normal affect. Extremities: No clubbing or cyanosis.   ECG: reviewed and available in electronic records.   Procedural Findings:  Hemodynamics:  AO 107/55  LV 108/5  Coronary angiography:  Coronary dominance: Right  Left mainstem: Long calcified segment with long 30% stenosis.  Left anterior descending (LAD): Ostial 30-40% with ostial calcification. Mid stent patent with mild in stent luminal irregularities.  Left circumflex (LCx): AV groove proximal moderate calcification. Long proximal 25% stenosis. Mid stent widely patent. MOM small and normal. PL large and normal.  Right coronary artery (RCA): Diffuse 25% stenosis. Proximal/mid stent widely patent with only mild luminal irregularities. PDA is large and free of disease.  Left ventriculography: Left ventricular systolic function is normal, LVEF is estimated at 65, there is no significant mitral regurgitation  Final Conclusions: Patent stents with nonobstructive disease. NL LV function.   Study Conclusions  - Left ventricle: Poor image quality Septal thickness not measured The cavity size was normal. Systolic function was normal. The estimated ejection fraction was in the range of 60% to 65%. - Aortic valve: Poorly visualized Small gradient across valve but appears to open well. There is some SAM and subvalvular gradient that seems more significant. Suggest TEE or f/u echo with beta blocker Rx - Mitral  valve: Mild regurgitation. - Left atrium: The atrium was mildly dilated. - Atrial septum: No defect or patent foramen ovale was identified.      ASSESSMENT AND PLAN:  CAD  Cath in 08/2011 with nonobstructive disease. Given her multiple drug-eluting stents, I will have her continue Plavix long-term. She will also stay on ASA 81, statin, ACEI, and Imdur. Due to her resting tachycardia and possible systolic anterior motion of the mitral leaflet, I will start Toprol-XL 25 mg daily.  Hyponatremia  Uncertain etiology. 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 No history of malignancy. Should not use thiazide diuretics in the future. Most recent BMET with normal sodium of 137 on 03/22/13.  HYPERTENSION  BP remains uncontrolled. I am starting Toprol-XL 25 mg daily for reasons documented above. Continue to monitor.  AS (aortic stenosis)  Mild by echo and exam.   Hyperlipidemia  She is on atorvastatin 80 mg daily. Most recent lipids show total cholesterol 126, triglycerides 94, HDL 56, LDL 51.  Anemia Most recent Hgb 8.5 on 03/23/13. Follow up with PCP.  Dispo: f/u 6 months.   Prentice DockerSuresh Koneswaran, M.D., F.A.C.C.

## 2013-08-26 ENCOUNTER — Other Ambulatory Visit: Payer: Self-pay | Admitting: *Deleted

## 2013-08-26 ENCOUNTER — Telehealth: Payer: Self-pay | Admitting: Cardiovascular Disease

## 2013-08-26 MED ORDER — ISOSORBIDE MONONITRATE ER 60 MG PO TB24: 60.0000 mg | ORAL_TABLET | Freq: Every day | ORAL | Status: DC

## 2013-08-26 MED ORDER — LEVOTHYROXINE SODIUM 50 MCG PO TABS: 50.0000 ug | ORAL_TABLET | Freq: Every day | ORAL | Status: AC

## 2013-08-26 NOTE — Telephone Encounter (Signed)
Patient's PCP or Endo provider needs to refill this medication.

## 2013-08-26 NOTE — Telephone Encounter (Signed)
Received fax refill request  Rx # [id number] Medication:  Levothyroxine 50 mcg tablet Qty 30 Sig:  Take one tablet by mouth once daily Physician:  Purvis Sheffield

## 2013-09-01 ENCOUNTER — Telehealth: Payer: Self-pay | Admitting: Cardiovascular Disease

## 2013-09-01 NOTE — Telephone Encounter (Signed)
Patient called stating that she needs refill on Plavix 75 mg (states [facility] has been faxing for several days)

## 2013-09-02 ENCOUNTER — Other Ambulatory Visit: Payer: Self-pay | Admitting: *Deleted

## 2013-09-02 MED ORDER — ATORVASTATIN CALCIUM 80 MG PO TABS: 80.0000 mg | ORAL_TABLET | Freq: Every day | ORAL | Status: DC

## 2013-09-02 MED ORDER — CLOPIDOGREL BISULFATE 75 MG PO TABS: 75.0000 mg | ORAL_TABLET | Freq: Every day | ORAL | Status: DC

## 2013-09-02 NOTE — Telephone Encounter (Signed)
Patient notified.  Refill sent to pharm.

## 2014-03-02 ENCOUNTER — Encounter (HOSPITAL_COMMUNITY): Payer: Self-pay | Admitting: Cardiovascular Disease

## 2014-03-15 ENCOUNTER — Other Ambulatory Visit: Payer: Self-pay | Admitting: *Deleted

## 2014-03-15 MED ORDER — CLOPIDOGREL BISULFATE 75 MG PO TABS: 75.0000 mg | ORAL_TABLET | Freq: Every day | ORAL | Status: DC

## 2014-03-15 MED ORDER — ISOSORBIDE MONONITRATE ER 60 MG PO TB24: 60.0000 mg | ORAL_TABLET | Freq: Every day | ORAL | Status: DC

## 2014-03-20 ENCOUNTER — Other Ambulatory Visit: Payer: Self-pay | Admitting: *Deleted

## 2014-03-20 MED ORDER — ATORVASTATIN CALCIUM 80 MG PO TABS: 80.0000 mg | ORAL_TABLET | Freq: Every day | ORAL | Status: DC

## 2014-03-20 NOTE — Telephone Encounter (Signed)
Atorvastatin refilled.  Overdue for 6 mo f/u.

## 2014-03-28 ENCOUNTER — Other Ambulatory Visit: Payer: Self-pay | Admitting: *Deleted

## 2014-03-28 MED ORDER — POTASSIUM CHLORIDE ER 10 MEQ PO TBCR: 10.0000 meq | EXTENDED_RELEASE_TABLET | Freq: Every day | ORAL | Status: DC

## 2014-03-28 NOTE — Telephone Encounter (Signed)
REFILL REQUEST RECEIVED FOR K+20 MEQ DAILY. PROFILE HERE IS FOR 10 MEQ AND THIS IS WHAT WAS SENT TO THE PHARMACY.

## 2014-05-19 ENCOUNTER — Other Ambulatory Visit: Payer: Self-pay | Admitting: *Deleted

## 2014-05-19 MED ORDER — ISOSORBIDE MONONITRATE ER 60 MG PO TB24: 60.0000 mg | ORAL_TABLET | Freq: Every day | ORAL | Status: DC

## 2014-05-19 MED ORDER — CLOPIDOGREL BISULFATE 75 MG PO TABS: 75.0000 mg | ORAL_TABLET | Freq: Every day | ORAL | Status: DC

## 2014-06-16 ENCOUNTER — Other Ambulatory Visit: Payer: Self-pay | Admitting: *Deleted

## 2014-06-16 MED ORDER — ATORVASTATIN CALCIUM 80 MG PO TABS: 80.0000 mg | ORAL_TABLET | Freq: Every day | ORAL | Status: DC

## 2014-06-16 NOTE — Telephone Encounter (Signed)
Atorvastatin refilled today

## 2014-06-21 ENCOUNTER — Ambulatory Visit: Payer: Self-pay | Admitting: Cardiovascular Disease

## 2014-07-13 ENCOUNTER — Other Ambulatory Visit: Payer: Self-pay | Admitting: *Deleted

## 2014-07-13 MED ORDER — ISOSORBIDE MONONITRATE ER 60 MG PO TB24: 60.0000 mg | ORAL_TABLET | Freq: Every day | ORAL | Status: DC

## 2014-07-13 MED ORDER — CLOPIDOGREL BISULFATE 75 MG PO TABS: 75.0000 mg | ORAL_TABLET | Freq: Every day | ORAL | Status: DC

## 2014-07-13 NOTE — Telephone Encounter (Signed)
Plavix & Imdur refilled today.

## 2014-08-10 ENCOUNTER — Ambulatory Visit (INDEPENDENT_AMBULATORY_CARE_PROVIDER_SITE_OTHER): Payer: Medicare Other | Admitting: Cardiovascular Disease

## 2014-08-10 ENCOUNTER — Encounter: Payer: Self-pay | Admitting: *Deleted

## 2014-08-10 ENCOUNTER — Encounter: Payer: Self-pay | Admitting: Cardiovascular Disease

## 2014-08-10 VITALS — BP 134/82 | HR 72 | Ht 60.0 in | Wt 226.0 lb

## 2014-08-10 DIAGNOSIS — I10 Essential (primary) hypertension: Secondary | ICD-10-CM | POA: Diagnosis not present

## 2014-08-10 DIAGNOSIS — I350 Nonrheumatic aortic (valve) stenosis: Secondary | ICD-10-CM

## 2014-08-10 DIAGNOSIS — I2510 Atherosclerotic heart disease of native coronary artery without angina pectoris: Secondary | ICD-10-CM | POA: Diagnosis not present

## 2014-08-10 DIAGNOSIS — Z955 Presence of coronary angioplasty implant and graft: Secondary | ICD-10-CM | POA: Diagnosis not present

## 2014-08-10 NOTE — Patient Instructions (Signed)
Continue all current medications. Your physician wants you to follow up in:  1 year.  You will receive a reminder letter in the mail one-two months in advance.  If you don't receive a letter, please call our office to schedule the follow up appointment   

## 2014-08-10 NOTE — Progress Notes (Signed)
Patient ID: Linna Darnereresa B Keator, female   DOB: 01/20/1951, 64 y.o.   MRN: [id number]      SUBJECTIVE: Mrs. Madilyn FiremanHayes is a 64 yr old woman with a history of CAD and PCI to multiple vessels, who presents for cardiology followup. She was hospitalized in 02/2013 for what was deemed to be unstable angina. She underwent coronary angiography which revealed nonobstructive disease with patent stents.  An echocardiogram showed normal left ventricular systolic function, EF 60-65%, with some mild systolic anterior motion of the mitral leaflet (as interpreted by Dr. Eden EmmsNishan) vs mild aortic stenosis (as reportedly interpreted by Dr. Shirlee LatchMcLean at bedside).   She feels well and denies chest pain, leg swelling, palpitations, orthopnea and paroxysmal nocturnal dyspnea. She has chronic exertional dyspnea which has not gotten worse. Denies being hospitalized in past 12 months. Has taken 2 SL nitroglycerin tablets in past 12 months.  Considering weight loss surgery. Her husband is Alford HighlandLinda Scales' brother, who is also my patient. He tells me Bonita QuinLinda is doing well after CABG.  ECG performed in the office today demonstrates normal sinus rhythm with an underlying right bundle-branch block.  Review of Systems: As per "subjective", otherwise negative.  No Known Allergies  Current Outpatient Prescriptions  Medication Sig Dispense Refill  . albuterol (PROVENTIL) (2.5 MG/3ML) 0.083% nebulizer solution Take 2.5 mg by nebulization every 6 (six) hours as needed for wheezing or shortness of breath.    Marland Kitchen. aspirin EC 81 MG tablet Take 81 mg by mouth daily.    Marland Kitchen. atorvastatin (LIPITOR) 80 MG tablet Take 1 tablet (80 mg total) by mouth daily. 30 tablet 1  . benazepril (LOTENSIN) 40 MG tablet Take 1 tablet by mouth daily.    Marland Kitchen. buPROPion (WELLBUTRIN XL) 150 MG 24 hr tablet Take 150 mg by mouth daily.     . clopidogrel (PLAVIX) 75 MG tablet Take 1 tablet (75 mg total) by mouth daily. 30 tablet 1  . diphenhydrAMINE (BENADRYL) 25 MG tablet Take 25 mg  by mouth 2 (two) times daily.    Marland Kitchen. docusate sodium (COLACE) 100 MG capsule Take 100 mg by mouth daily as needed for mild constipation.    . DULoxetine (CYMBALTA) 30 MG capsule Take 30 mg by mouth 2 (two) times daily.    . isosorbide mononitrate (IMDUR) 60 MG 24 hr tablet Take 1 tablet (60 mg total) by mouth daily. 30 tablet 1  . levothyroxine (SYNTHROID, LEVOTHROID) 50 MCG tablet Take 1 tablet (50 mcg total) by mouth daily. 30 tablet 3  . meloxicam (MOBIC) 15 MG tablet Take 15 mg by mouth daily.    . methocarbamol (ROBAXIN) 750 MG tablet Take 750 mg by mouth 4 (four) times daily.    . nitroGLYCERIN (NITROSTAT) 0.4 MG SL tablet Place 0.4 mg under the tongue every 5 (five) minutes as needed for chest pain.    . potassium chloride (K-DUR) 10 MEQ tablet Take 1 tablet (10 mEq total) by mouth daily. 30 tablet 3  . promethazine (PHENERGAN) 25 MG tablet Take 25 mg by mouth every 6 (six) hours as needed for nausea or vomiting.    . ranitidine (ZANTAC) 150 MG tablet Take 150 mg by mouth 2 (two) times daily.    . traZODone (DESYREL) 50 MG tablet Take 50 mg by mouth at bedtime.     . gabapentin (NEURONTIN) 300 MG capsule Take 1 capsule by mouth 3 (three) times daily.  0   No current facility-administered medications for this visit.    Past Medical History  Diagnosis Date  . Cardiac dysrhythmia, unspecified   . Anemia, unspecified   . Morbid obesity   . HTN (hypertension)   . Hypothyroidism   . CAD (coronary artery disease)     a. 2009 PCI/DES LAD, LCX, RCA, cba OM1;  b. 08/2011 Cath: LM nl, LAD, LCX, RCA - patent stents, OM1 patent, EF 55%,-> Med Rx;  c. 02/2013 Cath: LM 30, LAD 30-40ost, patent mid stent, LCX 25p, patent mid stent, RCA 25diff, patent prox/mid stent, EF 65%.  . Gastric ulcer   . GERD (gastroesophageal reflux disease)   . Chronic diastolic CHF (congestive heart failure)     a. 08/2011 Echo: EF 55-65%;  b. 02/2013 Echo: EF 60-65%, some SAM of AoV w subvalvular gradient that seems more  significant, rec tee or f/u echo w/ BB, mild MR, mildly dil LA  . Abnormal chest x-ray with multiple nodules     a. 08/2011 CXR nodular density Right mid to lower chest - f/u CXR 6 wks -> CT if abnl  . High cholesterol   . COPD (chronic obstructive pulmonary disease)   . History of blood transfusion 12/2012    "2 units for lower GIB"  . Acute lower GI bleeding 12/2012    "from diverticula"  . Serum hepatitis 1970  . FAOZHYQM(578.4Headache(784.0)     "monthly" (03/22/2013)  . Osteoarthrosis, unspecified whether generalized or localized, unspecified site   . DDD (degenerative disc disease)   . Arthritis     "knees"  . Chronic back pain 2002    "broke my back"     Past Surgical History  Procedure Laterality Date  . Cesarean section  1971  . Shoulder open rotator cuff repair Left 2010  . Joint replacement    . Coronary angioplasty with stent placement      mid lad (promus drug eluting stent) 2009. cutting balloon angioplasty first obtuse marginal. three overlapping stents in the RCA- promus DES x3  . Cardiac catheterization  08/19/2012  . Total knee arthroplasty Left 2011  . Tubal ligation  1985  . Vaginal hysterectomy  1988    "cut out 3/4th of my bladder accidentally" (03/22/2013)  . Dilation and curettage of uterus  1970    "6 month fetus; abruptio placenta"  . Left heart catheterization with coronary angiogram N/A 09/04/2011    Procedure: LEFT HEART CATHETERIZATION WITH CORONARY ANGIOGRAM;  Surgeon: Kathleene Hazelhristopher D McAlhany, MD;  [facility];  Service: Cardiovascular;  Laterality: N/A;  . Left heart catheterization with coronary angiogram N/A 03/21/2013    Procedure: LEFT HEART CATHETERIZATION WITH CORONARY ANGIOGRAM;  Surgeon: Rollene RotundaJames Hochrein, MD;  [facility];  Service: Cardiovascular;  Laterality: N/A;    History   Social History  . Marital Status: Married    Spouse Name: N/A  . Number of Children: N/A  . Years of Education: N/A   Occupational History  . Not on  file.   Social History Main Topics  . Smoking status: Former Smoker -- 0.50 packs/day for 40 years    Types: Cigarettes    Start date: 03/24/1966    Quit date: 04/24/2008  . Smokeless tobacco: Never Used  . Alcohol Use: No  . Drug Use: No  . Sexual Activity: Not Currently   Other Topics Concern  . Not on file   Social History Narrative   Married, does not get regular exercise. Pt works as an Charity fundraiserN at [employer].       Filed Vitals:   08/10/14  1017  BP: 134/82  Pulse: 72  Height: 5' (1.524 m)  Weight: 226 lb (102.513 kg)    PHYSICAL EXAM General: NAD HEENT: Normal. Neck: No JVD, no thyromegaly. Lungs: Clear to auscultation bilaterally with normal respiratory effort. CV: Nondisplaced PMI.  Regular rate and rhythm, normal S1/S2, no S3/S4, soft 1/6 systolic murmur along left lower sternal border. No pretibial or periankle edema.  No carotid bruit.   Abdomen: Soft, nontender, obese, no distention.  Neurologic: Alert and oriented x 3.  Psych: Normal affect. Skin: Normal. Musculoskeletal: No gross deformities. Extremities: No clubbing or cyanosis.   ECG: Most recent ECG reviewed.      ASSESSMENT AND PLAN: 1. CAD with multiple interventions: Stable ischemic heart disease. No changes to medical therapy. Continue aspirin 81 mg, Lipitor 80 mg, benazepril 40 mg, Plavix 75 mg, and Imdur 60 mg.  2. Essential HTN: Well controlled. No changes.  3. Hyperlipidemia: Will obtain copy of lipids from PCP. On high-dose Lipitor.  Dispo: f/u 1 year.   Prentice Docker, M.D., F.A.C.C.

## 2014-08-14 ENCOUNTER — Other Ambulatory Visit: Payer: Self-pay | Admitting: *Deleted

## 2014-08-14 DIAGNOSIS — E785 Hyperlipidemia, unspecified: Secondary | ICD-10-CM

## 2014-08-14 MED ORDER — ATORVASTATIN CALCIUM 80 MG PO TABS: 80.0000 mg | ORAL_TABLET | Freq: Every day | ORAL | Status: DC

## 2014-08-14 NOTE — Telephone Encounter (Signed)
Atorvastatin 80mg  refilled today.  Patient would like for us to follow cholesterol labs.  Will mail order to home.  [facility] when she has her port-a-cath flushed.

## 2014-09-07 ENCOUNTER — Telehealth: Payer: Self-pay | Admitting: *Deleted

## 2014-09-07 NOTE — Telephone Encounter (Signed)
Patient states she will have her Lipids done in July when she does other labs for Dr. Olena Leatherwood.

## 2014-09-08 ENCOUNTER — Telehealth: Payer: Self-pay | Admitting: Cardiovascular Disease

## 2014-09-08 MED ORDER — POTASSIUM CHLORIDE ER 10 MEQ PO TBCR: 10.0000 meq | EXTENDED_RELEASE_TABLET | Freq: Every day | ORAL | Status: DC

## 2014-09-08 NOTE — Telephone Encounter (Signed)
potassium chloride (K-DUR) 10 MEQ tablet - patient is out of medication [facility] [place]

## 2014-09-08 NOTE — Telephone Encounter (Signed)
Done

## 2014-09-18 ENCOUNTER — Other Ambulatory Visit: Payer: Self-pay | Admitting: *Deleted

## 2014-09-18 MED ORDER — CLOPIDOGREL BISULFATE 75 MG PO TABS: 75.0000 mg | ORAL_TABLET | Freq: Every day | ORAL | Status: DC

## 2014-09-18 MED ORDER — ISOSORBIDE MONONITRATE ER 60 MG PO TB24: 60.0000 mg | ORAL_TABLET | Freq: Every day | ORAL | Status: DC

## 2014-10-09 ENCOUNTER — Telehealth: Payer: Self-pay | Admitting: Cardiovascular Disease

## 2014-10-09 NOTE — Telephone Encounter (Signed)
Will forward to Dr Koneswaran's nurse  

## 2014-10-09 NOTE — Telephone Encounter (Signed)
Patient scheduled for gastric sleeve in august,apt [facility] 10/26/14 and needs to take with her cardiac pre-op risk assessment note with her.She understands dr.koneswaran out this week and it will probably be next week when available.She is an [facility] pt and wishes to pick up notece there.Call when ready @ [phone]

## 2014-10-09 NOTE — Telephone Encounter (Signed)
Needs letter for surgical clearance - asked patient to have surgeon send information to our office in reference to surgery needed You can call patient at [phone]

## 2014-10-11 NOTE — Telephone Encounter (Signed)
Please forward to Dr Charm RingsK's box, he may address when he returns  Stacy FerryJ Branch MD

## 2014-10-14 NOTE — Telephone Encounter (Signed)
Can proceed with surgery with probable intermediate risk for major adverse cardiac event in the perioperative period, given comorbidities (CAD with multiple stents). No preop testing needed.

## 2014-10-16 ENCOUNTER — Encounter: Payer: Self-pay | Admitting: *Deleted

## 2014-10-16 NOTE — Telephone Encounter (Signed)
Patient notified.  Stated she is still going through work up process, does not have anything scheduled yet.  Will do note on our letterhead for patient as requested & mail to home.   Also, asked her to have them fax [place] a cardiac clearance request form if any other info needed.  Patient verbalized understanding.

## 2014-10-16 NOTE — Telephone Encounter (Signed)
Attempted to notify patient - home # would not go through & mobile # has full voice mail box.

## 2015-02-21 ENCOUNTER — Telehealth: Payer: Self-pay

## 2015-02-21 MED ORDER — POTASSIUM CHLORIDE ER 10 MEQ PO TBCR: 10.0000 meq | EXTENDED_RELEASE_TABLET | Freq: Every day | ORAL | Status: DC

## 2015-02-21 MED ORDER — ATORVASTATIN CALCIUM 80 MG PO TABS: 80.0000 mg | ORAL_TABLET | Freq: Every day | ORAL | Status: DC

## 2015-02-21 MED ORDER — CLOPIDOGREL BISULFATE 75 MG PO TABS: 75.0000 mg | ORAL_TABLET | Freq: Every day | ORAL | Status: DC

## 2015-02-21 MED ORDER — ISOSORBIDE MONONITRATE ER 60 MG PO TB24: 60.0000 mg | ORAL_TABLET | Freq: Every day | ORAL | Status: DC

## 2015-02-21 NOTE — Telephone Encounter (Signed)
Sent in rx.

## 2015-02-23 ENCOUNTER — Other Ambulatory Visit: Payer: Self-pay

## 2015-02-23 MED ORDER — ATORVASTATIN CALCIUM 80 MG PO TABS: 80.0000 mg | ORAL_TABLET | Freq: Every day | ORAL | Status: AC

## 2015-02-23 NOTE — Telephone Encounter (Signed)
I changed rx to [facility] as this was second request from them for refill.We had local pharmacy listed

## 2015-02-26 ENCOUNTER — Other Ambulatory Visit: Payer: Self-pay

## 2015-02-28 ENCOUNTER — Telehealth: Payer: Self-pay | Admitting: Cardiovascular Disease

## 2015-02-28 MED ORDER — POTASSIUM CHLORIDE ER 10 MEQ PO TBCR: 10.0000 meq | EXTENDED_RELEASE_TABLET | Freq: Every day | ORAL | Status: DC

## 2015-02-28 MED ORDER — CLOPIDOGREL BISULFATE 75 MG PO TABS: 75.0000 mg | ORAL_TABLET | Freq: Every day | ORAL | Status: AC

## 2015-02-28 MED ORDER — ISOSORBIDE MONONITRATE ER 60 MG PO TB24: 60.0000 mg | ORAL_TABLET | Freq: Every day | ORAL | Status: DC

## 2015-02-28 NOTE — Telephone Encounter (Signed)
Patient switched script plans and needs all RX to go to [facility] Stated that [facility] faxed request to our office

## 2015-05-14 DIAGNOSIS — Z452 Encounter for adjustment and management of vascular access device: Secondary | ICD-10-CM | POA: Diagnosis not present

## 2015-06-05 DIAGNOSIS — I10 Essential (primary) hypertension: Secondary | ICD-10-CM | POA: Diagnosis not present

## 2015-06-05 DIAGNOSIS — K210 Gastro-esophageal reflux disease with esophagitis: Secondary | ICD-10-CM | POA: Diagnosis not present

## 2015-06-05 DIAGNOSIS — I25118 Atherosclerotic heart disease of native coronary artery with other forms of angina pectoris: Secondary | ICD-10-CM | POA: Diagnosis not present

## 2015-08-17 ENCOUNTER — Encounter: Payer: Self-pay | Admitting: *Deleted

## 2015-08-17 ENCOUNTER — Ambulatory Visit (INDEPENDENT_AMBULATORY_CARE_PROVIDER_SITE_OTHER): Payer: Medicare Other | Admitting: Cardiovascular Disease

## 2015-08-17 VITALS — BP 131/78 | HR 75 | Ht 60.0 in | Wt 217.0 lb

## 2015-08-17 DIAGNOSIS — I25118 Atherosclerotic heart disease of native coronary artery with other forms of angina pectoris: Secondary | ICD-10-CM

## 2015-08-17 DIAGNOSIS — R079 Chest pain, unspecified: Secondary | ICD-10-CM

## 2015-08-17 DIAGNOSIS — Z452 Encounter for adjustment and management of vascular access device: Secondary | ICD-10-CM | POA: Diagnosis not present

## 2015-08-17 DIAGNOSIS — R0609 Other forms of dyspnea: Secondary | ICD-10-CM

## 2015-08-17 DIAGNOSIS — I2510 Atherosclerotic heart disease of native coronary artery without angina pectoris: Secondary | ICD-10-CM

## 2015-08-17 DIAGNOSIS — I10 Essential (primary) hypertension: Secondary | ICD-10-CM | POA: Diagnosis not present

## 2015-08-17 DIAGNOSIS — I350 Nonrheumatic aortic (valve) stenosis: Secondary | ICD-10-CM | POA: Diagnosis not present

## 2015-08-17 DIAGNOSIS — E785 Hyperlipidemia, unspecified: Secondary | ICD-10-CM

## 2015-08-17 MED ORDER — NITROGLYCERIN 0.4 MG SL SUBL: 0.4000 mg | SUBLINGUAL_TABLET | SUBLINGUAL | Status: AC | PRN

## 2015-08-17 MED ORDER — RANOLAZINE ER 500 MG PO TB12: 500.0000 mg | ORAL_TABLET | Freq: Two times a day (BID) | ORAL | Status: DC

## 2015-08-17 NOTE — Addendum Note (Signed)
Addended by: Lesle ChrisHILL, Zaynah Chawla G on: 08/17/2015 11:50 AM   Modules accepted: Orders

## 2015-08-17 NOTE — Progress Notes (Signed)
Patient ID: Akili Cuda, female   DOB: May 24, 1950, 65 y.o.   MRN: [id number]      SUBJECTIVE: Mrs. [name] presents for routine cardiovascular follow-up. She has a history of CAD and PCI to multiple vessels. She was hospitalized in 02/2013 for what was deemed to be unstable angina. She underwent coronary angiography which revealed nonobstructive disease with patent stents.  An echocardiogram showed normal left ventricular systolic function, EF 60-65%, with some mild systolic anterior motion of the mitral leaflet (as interpreted by Dr. Eden Emms) vs mild aortic stenosis (as reportedly interpreted by Dr. Shirlee Latch at bedside).   She has chronic exertional dyspnea but has felt more short of breath with exertion in the past 2 months. She has also had more chest pains and had to take more nitroglycerin. She has had palpitations and her PCP started diltiazem 120 mg daily. She has had headaches as well.   Review of Systems: As per "subjective", otherwise negative.  No Known Allergies  Current Outpatient Prescriptions  Medication Sig Dispense Refill  . albuterol (PROVENTIL) (2.5 MG/3ML) 0.083% nebulizer solution Take 2.5 mg by nebulization every 6 (six) hours as needed for wheezing or shortness of breath.    Marland Kitchen aspirin EC 81 MG tablet Take 81 mg by mouth daily.    Marland Kitchen atorvastatin (LIPITOR) 80 MG tablet Take 1 tablet (80 mg total) by mouth daily. 90 tablet 1  . benazepril (LOTENSIN) 40 MG tablet Take 1 tablet by mouth daily.    Marland Kitchen buPROPion (WELLBUTRIN XL) 150 MG 24 hr tablet Take 150 mg by mouth daily.     . clopidogrel (PLAVIX) 75 MG tablet Take 1 tablet (75 mg total) by mouth daily. 90 tablet 3  . diltiazem (CARDIZEM CD) 120 MG 24 hr capsule Take 1 capsule by mouth daily.    . diphenhydrAMINE (BENADRYL) 25 MG tablet Take 25 mg by mouth 2 (two) times daily.    Marland Kitchen docusate sodium (COLACE) 100 MG capsule Take 100 mg by mouth daily as needed for mild constipation.    . DULoxetine (CYMBALTA) 30 MG capsule  Take 30 mg by mouth 2 (two) times daily.    Marland Kitchen gabapentin (NEURONTIN) 300 MG capsule Take 1 capsule by mouth 3 (three) times daily.  0  . gentamicin (GARAMYCIN) 0.3 % ophthalmic solution Place 1 drop into both eyes every 8 (eight) hours.    . isosorbide mononitrate (IMDUR) 60 MG 24 hr tablet Take 1 tablet (60 mg total) by mouth daily. 90 tablet 3  . levothyroxine (SYNTHROID, LEVOTHROID) 50 MCG tablet Take 1 tablet (50 mcg total) by mouth daily. 30 tablet 3  . meloxicam (MOBIC) 15 MG tablet Take 15 mg by mouth daily.    . methocarbamol (ROBAXIN) 750 MG tablet Take 750 mg by mouth 4 (four) times daily.    . nitroGLYCERIN (NITROSTAT) 0.4 MG SL tablet Place 0.4 mg under the tongue every 5 (five) minutes as needed for chest pain.    . potassium chloride (K-DUR) 10 MEQ tablet Take 1 tablet (10 mEq total) by mouth daily. 90 tablet 3  . promethazine (PHENERGAN) 25 MG tablet Take 25 mg by mouth every 6 (six) hours as needed for nausea or vomiting.    . ranitidine (ZANTAC) 150 MG tablet Take 150 mg by mouth 2 (two) times daily.    . ranitidine (ZANTAC) 300 MG tablet Take 1 tablet by mouth 2 (two) times daily.    . traZODone (DESYREL) 100 MG tablet Take 1 tablet by mouth daily.    Marland Kitchen  traZODone (DESYREL) 50 MG tablet Take 50 mg by mouth at bedtime.      No current facility-administered medications for this visit.    Past Medical History  Diagnosis Date  . Cardiac dysrhythmia, unspecified   . Anemia, unspecified   . Morbid obesity   . HTN (hypertension)   . Hypothyroidism   . CAD (coronary artery disease)     a. 2009 PCI/DES LAD, LCX, RCA, cba OM1;  b. 08/2011 Cath: LM nl, LAD, LCX, RCA - patent stents, OM1 patent, EF 55%,-> Med Rx;  c. 02/2013 Cath: LM 30, LAD 30-40ost, patent mid stent, LCX 25p, patent mid stent, RCA 25diff, patent prox/mid stent, EF 65%.  . Gastric ulcer   . GERD (gastroesophageal reflux disease)   . Chronic diastolic CHF (congestive heart failure)     a. 08/2011 Echo: EF 55-65%;  b.  02/2013 Echo: EF 60-65%, some SAM of AoV w subvalvular gradient that seems more significant, rec tee or f/u echo w/ BB, mild MR, mildly dil LA  . Abnormal chest x-ray with multiple nodules     a. 08/2011 CXR nodular density Right mid to lower chest - f/u CXR 6 wks -> CT if abnl  . High cholesterol   . COPD (chronic obstructive pulmonary disease)   . History of blood transfusion 12/2012    "2 units for lower GIB"  . Acute lower GI bleeding 12/2012    "from diverticula"  . Serum hepatitis 1970  . ZOXWRUEA(540.9)     "monthly" (03/22/2013)  . Osteoarthrosis, unspecified whether generalized or localized, unspecified site   . DDD (degenerative disc disease)   . Arthritis     "knees"  . Chronic back pain 2002    "broke my back"     Past Surgical History  Procedure Laterality Date  . Cesarean section  1971  . Shoulder open rotator cuff repair Left 2010  . Joint replacement    . Coronary angioplasty with stent placement      mid lad (promus drug eluting stent) 2009. cutting balloon angioplasty first obtuse marginal. three overlapping stents in the RCA- promus DES x3  . Cardiac catheterization  08/19/2012  . Total knee arthroplasty Left 2011  . Tubal ligation  1985  . Vaginal hysterectomy  1988    "cut out 3/4th of my bladder accidentally" (03/22/2013)  . Dilation and curettage of uterus  1970    "6 month fetus; abruptio placenta"  . Left heart catheterization with coronary angiogram N/A 09/04/2011    Procedure: LEFT HEART CATHETERIZATION WITH CORONARY ANGIOGRAM;  Surgeon: Kathleene Hazel, MD;  Location: [facility] CATH LAB;  Service: Cardiovascular;  Laterality: N/A;  . Left heart catheterization with coronary angiogram N/A 03/21/2013    Procedure: LEFT HEART CATHETERIZATION WITH CORONARY ANGIOGRAM;  Surgeon: Rollene Rotunda, MD;  Location: [facility] CATH LAB;  Service: Cardiovascular;  Laterality: N/A;    Social History   Social History  . Marital Status: Married    Spouse Name: N/A  .  Number of Children: N/A  . Years of Education: N/A   Occupational History  . Not on file.   Social History Main Topics  . Smoking status: Former Smoker -- 0.50 packs/day for 40 years    Types: Cigarettes    Start date: 03/24/1966    Quit date: 04/24/2008  . Smokeless tobacco: Never Used  . Alcohol Use: No  . Drug Use: No  . Sexual Activity: Not Currently   Other Topics Concern  . Not on file  Social History Narrative   Married, does not get regular exercise. Pt works as an Charity fundraiserN at [employer].       Filed Vitals:   08/17/15 1048  BP: 131/78  Pulse: 75  Height: 5' (1.524 m)  Weight: 217 lb (98.431 kg)    PHYSICAL EXAM General: NAD HEENT: Normal. Neck: No JVD, no thyromegaly. Lungs: Clear to auscultation bilaterally with normal respiratory effort. CV: Nondisplaced PMI. Regular rate and rhythm, normal S1/S2, no S3/S4, soft 1/6 systolic murmur along left lower sternal border. No pretibial or periankle edema. No carotid bruit.  Abdomen: Soft, nontender, obese, no distention.  Neurologic: Alert and oriented x 3.  Psych: Normal affect. Skin: Normal.   ECG: Most recent ECG reviewed.      ASSESSMENT AND PLAN: 1. Chest pain and shortness of breath in context of CAD with multiple interventions: Addition of diltiazem by PCP did not do much to alleviate symptoms. Continue aspirin 81 mg, Lipitor 80 mg, benazepril 40 mg, Plavix 75 mg, and Imdur 60 mg (she did not want dose increased due to headaches). I will try Ranexa 500 mg bid. I will also obtain a Lexiscan Myoview stress test.  2. Essential HTN: Well controlled. No changes.  3. Hyperlipidemia: Will obtain copy of lipids from PCP. On high-dose Lipitor.  Dispo: f/u 1 month.  Prentice DockerSuresh Koneswaran, M.D., F.A.C.C.

## 2015-08-17 NOTE — Addendum Note (Signed)
Addended by: Lesle ChrisHILL, Masashi Snowdon G on: 08/17/2015 11:37 AM   Modules accepted: Orders

## 2015-08-17 NOTE — Patient Instructions (Signed)
Your physician has requested that you have a lexiscan myoview. For further information please visit [email or web address]. Please follow instruction sheet, as given. Office will contact with results via phone or letter.   Begin Ranexa 500mg  twice a day  - samples given today. Please call the office 1 week if able to tolerate the new medication. Continue all other medications.   Follow up in  1 month.

## 2015-08-19 ENCOUNTER — Encounter: Payer: Self-pay | Admitting: Cardiovascular Disease

## 2015-08-21 ENCOUNTER — Telehealth: Payer: Self-pay | Admitting: Cardiovascular Disease

## 2015-08-21 NOTE — Telephone Encounter (Signed)
Patient is now questioning if she can go with increasing her Imdur.  Can not afford the Ranexa.

## 2015-08-21 NOTE — Telephone Encounter (Signed)
Mrs. [name] called stating that she can not afford the Ranexa 500 mg. States that it is $125.00. She is requesting to be put on something else .

## 2015-08-22 NOTE — Telephone Encounter (Signed)
She complained of headaches which is why I didn't increase Imdur before and tried Ranexa. Is that cost with the coupon card and company assistance? If it is still not affordable after aforementioned options have been utilized, could then try increasing Imdur to 90 mg.

## 2015-08-23 MED ORDER — RANOLAZINE ER 500 MG PO TB12: 500.0000 mg | ORAL_TABLET | Freq: Two times a day (BID) | ORAL | Status: AC

## 2015-08-23 NOTE — Telephone Encounter (Signed)
Discussed below with patient.  She is unable to use the co-pay card due to her Medicare.  Only works for [employer] insurance.  Ranexa Connect application will be given to patient along with more samples till we get cost issue resolved.

## 2015-08-24 ENCOUNTER — Encounter (HOSPITAL_COMMUNITY)
Admission: RE | Admit: 2015-08-24 | Discharge: 2015-08-24 | Disposition: A | Discharge status: Home / Self Care | Payer: Medicare Other | Source: Ambulatory Visit | Attending: Cardiovascular Disease | Admitting: Cardiovascular Disease

## 2015-08-24 ENCOUNTER — Inpatient Hospital Stay (HOSPITAL_COMMUNITY): Admission: RE | Admit: 2015-08-24 | Payer: Medicare Other | Source: Ambulatory Visit

## 2015-08-24 DIAGNOSIS — I25118 Atherosclerotic heart disease of native coronary artery with other forms of angina pectoris: Secondary | ICD-10-CM | POA: Insufficient documentation

## 2015-08-24 DIAGNOSIS — R079 Chest pain, unspecified: Secondary | ICD-10-CM | POA: Diagnosis not present

## 2015-08-24 LAB — NM MYOCAR MULTI W/SPECT W/WALL MOTION / EF
CHL CUP NUCLEAR SDS: 0
CHL CUP NUCLEAR SRS: 0
CHL CUP RESTING HR STRESS: 63 {beats}/min
LV sys vol: 17 mL
LVDIAVOL: 67 mL (ref 46–106)
Peak HR: 88 {beats}/min
RATE: 0.31
SSS: 0
TID: 1.15

## 2015-08-24 MED ORDER — TECHNETIUM TC 99M TETROFOSMIN IV KIT
30.0000 | PACK | Freq: Once | INTRAVENOUS | Status: AC | PRN
  Administered 2015-08-24: 30 via INTRAVENOUS

## 2015-08-24 MED ORDER — SODIUM CHLORIDE 0.9% FLUSH
INTRAVENOUS | Status: AC
  Administered 2015-08-24: 10 mL via INTRAVENOUS
  Filled 2015-08-24: qty 10

## 2015-08-24 MED ORDER — REGADENOSON 0.4 MG/5ML IV SOLN
INTRAVENOUS | Status: AC
  Administered 2015-08-24: 0.4 mg via INTRAVENOUS
  Filled 2015-08-24: qty 5

## 2015-08-24 MED ORDER — TECHNETIUM TC 99M TETROFOSMIN IV KIT
10.0000 | PACK | Freq: Once | INTRAVENOUS | Status: AC | PRN
  Administered 2015-08-24: 10 via INTRAVENOUS

## 2015-08-29 ENCOUNTER — Telehealth: Payer: Self-pay | Admitting: *Deleted

## 2015-08-29 NOTE — Telephone Encounter (Signed)
Notes Recorded by Lesle ChrisAngela G Hill, LPN on 6/5/78466/09/2015 at 9:41 AM Patient notified and verbalized understanding. Already has follow up scheduled for 09/26/2015. Copy to pmd.  Notes Recorded by Laqueta LindenSuresh A Koneswaran, MD on 08/24/2015 at 1:23 PM Normal.

## 2015-09-18 ENCOUNTER — Ambulatory Visit: Payer: Medicare Other | Admitting: Cardiovascular Disease

## 2015-09-26 ENCOUNTER — Encounter: Payer: Self-pay | Admitting: Cardiovascular Disease

## 2015-09-26 ENCOUNTER — Ambulatory Visit (INDEPENDENT_AMBULATORY_CARE_PROVIDER_SITE_OTHER): Payer: Medicare Other | Admitting: Cardiovascular Disease

## 2015-09-26 VITALS — BP 112/74 | HR 76 | Ht 60.0 in | Wt 214.0 lb

## 2015-09-26 DIAGNOSIS — I25118 Atherosclerotic heart disease of native coronary artery with other forms of angina pectoris: Secondary | ICD-10-CM | POA: Diagnosis not present

## 2015-09-26 DIAGNOSIS — I10 Essential (primary) hypertension: Secondary | ICD-10-CM

## 2015-09-26 DIAGNOSIS — I350 Nonrheumatic aortic (valve) stenosis: Secondary | ICD-10-CM | POA: Diagnosis not present

## 2015-09-26 DIAGNOSIS — R0609 Other forms of dyspnea: Secondary | ICD-10-CM

## 2015-09-26 DIAGNOSIS — R079 Chest pain, unspecified: Secondary | ICD-10-CM | POA: Diagnosis not present

## 2015-09-26 DIAGNOSIS — Z955 Presence of coronary angioplasty implant and graft: Secondary | ICD-10-CM

## 2015-09-26 DIAGNOSIS — E785 Hyperlipidemia, unspecified: Secondary | ICD-10-CM

## 2015-09-26 DIAGNOSIS — Z452 Encounter for adjustment and management of vascular access device: Secondary | ICD-10-CM | POA: Diagnosis not present

## 2015-09-26 NOTE — Patient Instructions (Signed)
Medication Instructions:  Continue all current medications.  Labwork: NONE  Testing/Procedures: NONE  Follow-Up: Your physician wants you to follow up in: 6 months.  You will receive a reminder letter in the mail one-two months in advance.  If you don't receive a letter, please call our office to schedule the follow up appointment.    Any Other Special Instructions Will Be Listed Below (If Applicable).  If you need a refill on your cardiac medications before your next appointment, please call your pharmacy.  

## 2015-09-26 NOTE — Progress Notes (Signed)
Patient ID: [name], female   DOB: 12/13/1950, 65 y.o.   MRN: [id number]      SUBJECTIVE: The patient presents for follow-up of chest pain and shortness of breath in the context of coronary artery disease with multiple prior interventions.  Nuclear stress testing was normal on 08/24/15.  She was hospitalized in 02/2013 for what was deemed to be unstable angina. She underwent coronary angiography which revealed nonobstructive disease with patent stents.  An echocardiogram showed normal left ventricular systolic function, EF 60-65%, with some mild systolic anterior motion of the mitral leaflet (as interpreted by Dr. Eden EmmsNishan) vs mild aortic stenosis (as reportedly interpreted by Dr. Shirlee LatchMcLean at bedside).   I started Ranexa at her last visit. She said "I feel like a brand-new person. I feel wonderful. I'm no longer short of breath when walking to the kitchen. This medication has changed my life."    Review of Systems: As per "subjective", otherwise negative.  No Known Allergies  Current Outpatient Prescriptions  Medication Sig Dispense Refill  . albuterol (PROVENTIL) (2.5 MG/3ML) 0.083% nebulizer solution Take 2.5 mg by nebulization every 6 (six) hours as needed for wheezing or shortness of breath.    Marland Kitchen. aspirin EC 81 MG tablet Take 81 mg by mouth daily.    Marland Kitchen. atorvastatin (LIPITOR) 80 MG tablet Take 1 tablet (80 mg total) by mouth daily. 90 tablet 1  . benazepril (LOTENSIN) 40 MG tablet Take 1 tablet by mouth daily.    Marland Kitchen. buPROPion (WELLBUTRIN XL) 150 MG 24 hr tablet Take 150 mg by mouth daily.     . clopidogrel (PLAVIX) 75 MG tablet Take 1 tablet (75 mg total) by mouth daily. 90 tablet 3  . diltiazem (CARDIZEM CD) 120 MG 24 hr capsule Take 1 capsule by mouth daily.    . diphenhydrAMINE (BENADRYL) 25 MG tablet Take 25 mg by mouth 2 (two) times daily.    Marland Kitchen. docusate sodium (COLACE) 100 MG capsule Take 100 mg by mouth daily as needed for mild constipation.    . DULoxetine (CYMBALTA) 30  MG capsule Take 30 mg by mouth 2 (two) times daily.    Marland Kitchen. gabapentin (NEURONTIN) 300 MG capsule Take 1 capsule by mouth 3 (three) times daily.  0  . gentamicin (GARAMYCIN) 0.3 % ophthalmic solution Place 1 drop into both eyes every 8 (eight) hours.    . isosorbide mononitrate (IMDUR) 60 MG 24 hr tablet Take 1 tablet (60 mg total) by mouth daily. 90 tablet 3  . levothyroxine (SYNTHROID, LEVOTHROID) 50 MCG tablet Take 1 tablet (50 mcg total) by mouth daily. 30 tablet 3  . meloxicam (MOBIC) 15 MG tablet Take 15 mg by mouth daily.    . methocarbamol (ROBAXIN) 750 MG tablet Take 750 mg by mouth 4 (four) times daily.    . nitroGLYCERIN (NITROSTAT) 0.4 MG SL tablet Place 1 tablet (0.4 mg total) under the tongue every 5 (five) minutes as needed for chest pain. 25 tablet 3  . potassium chloride (K-DUR) 10 MEQ tablet Take 1 tablet (10 mEq total) by mouth daily. 90 tablet 3  . promethazine (PHENERGAN) 25 MG tablet Take 25 mg by mouth every 6 (six) hours as needed for nausea or vomiting.    . ranitidine (ZANTAC) 300 MG tablet Take 1 tablet by mouth 2 (two) times daily.    . ranolazine (RANEXA) 500 MG 12 hr tablet Take 1 tablet (500 mg total) by mouth 2 (two) times daily. 28 tablet 0  . traZODone (DESYREL)  100 MG tablet Take 1 tablet by mouth daily.     No current facility-administered medications for this visit.    Past Medical History  Diagnosis Date  . Cardiac dysrhythmia, unspecified   . Anemia, unspecified   . Morbid obesity (HCC)   . HTN (hypertension)   . Hypothyroidism   . CAD (coronary artery disease)     a. 2009 PCI/DES LAD, LCX, RCA, cba OM1;  b. 08/2011 Cath: LM nl, LAD, LCX, RCA - patent stents, OM1 patent, EF 55%,-> Med Rx;  c. 02/2013 Cath: LM 30, LAD 30-40ost, patent mid stent, LCX 25p, patent mid stent, RCA 25diff, patent prox/mid stent, EF 65%.  . Gastric ulcer   . GERD (gastroesophageal reflux disease)   . Chronic diastolic CHF (congestive heart failure) (HCC)     a. 08/2011 Echo: EF  55-65%;  b. 02/2013 Echo: EF 60-65%, some SAM of AoV w subvalvular gradient that seems more significant, rec tee or f/u echo w/ BB, mild MR, mildly dil LA  . Abnormal chest x-ray with multiple nodules     a. 08/2011 CXR nodular density Right mid to lower chest - f/u CXR 6 wks -> CT if abnl  . High cholesterol   . COPD (chronic obstructive pulmonary disease) (HCC)   . History of blood transfusion 12/2012    "2 units for lower GIB"  . Acute lower GI bleeding 12/2012    "from diverticula"  . Serum hepatitis 1970  . ZOXWRUEA(540.9)     "monthly" (03/22/2013)  . Osteoarthrosis, unspecified whether generalized or localized, unspecified site   . DDD (degenerative disc disease)   . Arthritis     "knees"  . Chronic back pain 2002    "broke my back"     Past Surgical History  Procedure Laterality Date  . Cesarean section  1971  . Shoulder open rotator cuff repair Left 2010  . Joint replacement    . Coronary angioplasty with stent placement      mid lad (promus drug eluting stent) 2009. cutting balloon angioplasty first obtuse marginal. three overlapping stents in the RCA- promus DES x3  . Cardiac catheterization  08/19/2012  . Total knee arthroplasty Left 2011  . Tubal ligation  1985  . Vaginal hysterectomy  1988    "cut out 3/4th of my bladder accidentally" (03/22/2013)  . Dilation and curettage of uterus  1970    "6 month fetus; abruptio placenta"  . Left heart catheterization with coronary angiogram N/A 09/04/2011    Procedure: LEFT HEART CATHETERIZATION WITH CORONARY ANGIOGRAM;  Surgeon: Kathleene Hazel, MD;  Location: [facility] CATH LAB;  Service: Cardiovascular;  Laterality: N/A;  . Left heart catheterization with coronary angiogram N/A 03/21/2013    Procedure: LEFT HEART CATHETERIZATION WITH CORONARY ANGIOGRAM;  Surgeon: Rollene Rotunda, MD;  Location[facility] C[facility] LAB;  Service: Cardiovascular;  Laterality: N/A;    Social History   Social History  . Marital Status: Married    Spouse  Name: N/A  . Number of Children: N/A  . Years of Education: N/A   Occupational History  . Not on file.   Social History Main Topics  . Smoking status: Former Smoker -- 0.50 packs/day for 40 years    Types: Cigarettes    Start date: 03/24/1966    Quit date: 04/24/2008  . Smokeless tobacco: Never Used  . Alcohol Use: No  . Drug Use: No  . Sexual Activity: Not Currently   Other Topics Concern  . Not on file  Social History Narrative   Married, does not get regular exercise. Pt works as an Charity fundraiserN at [employer].       Filed Vitals:   09/26/15 1055  BP: 112/74  Pulse: 76  Height: 5' (1.524 m)  Weight: 214 lb (97.07 kg)  SpO2: 96%    PHYSICAL EXAM General: NAD HEENT: Normal. Neck: No JVD, no thyromegaly. Lungs: Clear to auscultation bilaterally with normal respiratory effort. CV: Nondisplaced PMI. Regular rate and rhythm, normal S1/S2, no S3/S4, soft 1/6 systolic murmur along left lower sternal border. No pretibial or periankle edema.   Abdomen: Soft, nontender, obese, no distention.  Neurologic: Alert and oriented x 3.  Psych: Normal affect. Skin: Normal.   ECG: Most recent ECG reviewed.      ASSESSMENT AND PLAN: 1. Chest pain and shortness of breath in context of CAD with multiple interventions: Normal nuclear stress test as noted above. Symptomatic improvement with Ranexa 500 mg bid.  Continue aspirin 81 mg, Lipitor 80 mg, benazepril 40 mg, Plavix 75 mg, Ranexa, and Imdur 60 mg (she did not want dose increased in past due to headaches).   2. Essential HTN: Well controlled. No changes.  3. Hyperlipidemia: Will obtain copy of lipids from PCP. On high-dose Lipitor.  Dispo: f/u 6 months.   Prentice DockerSuresh Aarvi Stotts, M.D., F.A.C.C.

## 2015-10-05 ENCOUNTER — Other Ambulatory Visit: Payer: Self-pay | Admitting: Cardiovascular Disease

## 2015-10-09 DIAGNOSIS — Z0000 Encounter for general adult medical examination without abnormal findings: Secondary | ICD-10-CM | POA: Diagnosis not present

## 2015-10-09 DIAGNOSIS — K210 Gastro-esophageal reflux disease with esophagitis: Secondary | ICD-10-CM | POA: Diagnosis not present

## 2015-10-09 DIAGNOSIS — I2584 Coronary atherosclerosis due to calcified coronary lesion: Secondary | ICD-10-CM | POA: Diagnosis not present

## 2015-10-09 DIAGNOSIS — E038 Other specified hypothyroidism: Secondary | ICD-10-CM | POA: Diagnosis not present

## 2015-10-09 DIAGNOSIS — I10 Essential (primary) hypertension: Secondary | ICD-10-CM | POA: Diagnosis not present

## 2015-10-09 DIAGNOSIS — G2581 Restless legs syndrome: Secondary | ICD-10-CM | POA: Diagnosis not present

## 2015-10-09 DIAGNOSIS — Z1389 Encounter for screening for other disorder: Secondary | ICD-10-CM | POA: Diagnosis not present

## 2015-11-03 ENCOUNTER — Other Ambulatory Visit: Payer: Self-pay | Admitting: Cardiovascular Disease

## 2016-01-08 DIAGNOSIS — K210 Gastro-esophageal reflux disease with esophagitis: Secondary | ICD-10-CM | POA: Diagnosis not present

## 2016-01-08 DIAGNOSIS — I10 Essential (primary) hypertension: Secondary | ICD-10-CM | POA: Diagnosis not present

## 2016-01-08 DIAGNOSIS — I2584 Coronary atherosclerosis due to calcified coronary lesion: Secondary | ICD-10-CM | POA: Diagnosis not present

## 2016-01-08 DIAGNOSIS — Z452 Encounter for adjustment and management of vascular access device: Secondary | ICD-10-CM | POA: Diagnosis not present

## 2016-01-08 DIAGNOSIS — E038 Other specified hypothyroidism: Secondary | ICD-10-CM | POA: Diagnosis not present

## 2016-01-25 ENCOUNTER — Telehealth: Payer: Self-pay | Admitting: Cardiovascular Disease

## 2016-01-25 NOTE — Telephone Encounter (Signed)
[name] has questions about her medications. States that she is having problems with shortness of breath.

## 2016-01-25 NOTE — Telephone Encounter (Signed)
Ranexa was doing good for the last 3 months.  Currently taking Ranexa 500mg  twice a day.  Noticing that the SOB and angina is coming back in between doses.  Stated that Dr. Kirtland BouchardK discussed with her at last OV, that if her symptoms persisted he may need to go up on the Ranexa.  Advised patient to go to ED if symptoms worsened over the weekend.  Also, reminded her about using her prn NTG.  Patient verbalized understanding.

## 2016-01-28 DIAGNOSIS — Z8619 Personal history of other infectious and parasitic diseases: Secondary | ICD-10-CM | POA: Diagnosis not present

## 2016-01-28 DIAGNOSIS — M1990 Unspecified osteoarthritis, unspecified site: Secondary | ICD-10-CM | POA: Diagnosis not present

## 2016-01-28 DIAGNOSIS — Z7902 Long term (current) use of antithrombotics/antiplatelets: Secondary | ICD-10-CM | POA: Diagnosis not present

## 2016-01-28 DIAGNOSIS — Z79899 Other long term (current) drug therapy: Secondary | ICD-10-CM | POA: Diagnosis not present

## 2016-01-28 DIAGNOSIS — I2510 Atherosclerotic heart disease of native coronary artery without angina pectoris: Secondary | ICD-10-CM | POA: Diagnosis not present

## 2016-01-28 DIAGNOSIS — Z955 Presence of coronary angioplasty implant and graft: Secondary | ICD-10-CM | POA: Diagnosis not present

## 2016-01-28 DIAGNOSIS — E871 Hypo-osmolality and hyponatremia: Secondary | ICD-10-CM | POA: Diagnosis not present

## 2016-01-28 DIAGNOSIS — Z79891 Long term (current) use of opiate analgesic: Secondary | ICD-10-CM | POA: Diagnosis not present

## 2016-01-28 DIAGNOSIS — R079 Chest pain, unspecified: Secondary | ICD-10-CM | POA: Diagnosis not present

## 2016-01-28 DIAGNOSIS — E039 Hypothyroidism, unspecified: Secondary | ICD-10-CM | POA: Diagnosis not present

## 2016-01-28 DIAGNOSIS — D638 Anemia in other chronic diseases classified elsewhere: Secondary | ICD-10-CM | POA: Diagnosis not present

## 2016-01-28 DIAGNOSIS — R05 Cough: Secondary | ICD-10-CM | POA: Diagnosis not present

## 2016-01-28 DIAGNOSIS — M479 Spondylosis, unspecified: Secondary | ICD-10-CM | POA: Diagnosis not present

## 2016-01-28 DIAGNOSIS — Z7982 Long term (current) use of aspirin: Secondary | ICD-10-CM | POA: Diagnosis not present

## 2016-01-28 DIAGNOSIS — R0789 Other chest pain: Secondary | ICD-10-CM | POA: Diagnosis not present

## 2016-01-28 DIAGNOSIS — I10 Essential (primary) hypertension: Secondary | ICD-10-CM | POA: Diagnosis not present

## 2016-01-28 DIAGNOSIS — K219 Gastro-esophageal reflux disease without esophagitis: Secondary | ICD-10-CM | POA: Diagnosis not present

## 2016-01-28 NOTE — Telephone Encounter (Signed)
Can increase ranexa 1000 mg BID to see if it helps. Go to ER for prolonged chest pain. Make sure she has f/u with Purvis SheffieldKoneswaran

## 2016-01-28 NOTE — Telephone Encounter (Signed)
Wanted to let Inocencio HomesGayle know that he had [facility]

## 2016-01-29 DIAGNOSIS — Z955 Presence of coronary angioplasty implant and graft: Secondary | ICD-10-CM | POA: Diagnosis not present

## 2016-01-29 DIAGNOSIS — I2510 Atherosclerotic heart disease of native coronary artery without angina pectoris: Secondary | ICD-10-CM | POA: Diagnosis not present

## 2016-01-29 DIAGNOSIS — R0789 Other chest pain: Secondary | ICD-10-CM | POA: Diagnosis not present

## 2016-01-29 DIAGNOSIS — I10 Essential (primary) hypertension: Secondary | ICD-10-CM | POA: Diagnosis not present

## 2016-01-29 NOTE — Telephone Encounter (Signed)
Noted, will request [facility] notes.

## 2016-02-04 ENCOUNTER — Encounter: Payer: Self-pay | Admitting: *Deleted

## 2016-02-04 ENCOUNTER — Telehealth: Payer: Self-pay | Admitting: *Deleted

## 2016-02-04 NOTE — Telephone Encounter (Signed)
[facility]  Discharge dx was chronic gastritis due to use of Ibuprofen.  Follow up was recommended with her PMD.  Will keep her cariology follow up as previously planned.

## 2016-02-13 DIAGNOSIS — I208 Other forms of angina pectoris: Secondary | ICD-10-CM | POA: Diagnosis not present

## 2016-04-14 DIAGNOSIS — Z0000 Encounter for general adult medical examination without abnormal findings: Secondary | ICD-10-CM | POA: Diagnosis not present

## 2016-04-14 DIAGNOSIS — I10 Essential (primary) hypertension: Secondary | ICD-10-CM | POA: Diagnosis not present

## 2016-04-14 DIAGNOSIS — Z452 Encounter for adjustment and management of vascular access device: Secondary | ICD-10-CM | POA: Diagnosis not present

## 2016-04-14 DIAGNOSIS — I208 Other forms of angina pectoris: Secondary | ICD-10-CM | POA: Diagnosis not present

## 2016-06-01 ENCOUNTER — Inpatient Hospital Stay (HOSPITAL_COMMUNITY)
Admission: EM | Admit: 2016-06-01 | Discharge: 2016-06-03 | DRG: 287 | Disposition: A | Discharge status: Home / Self Care | Payer: Medicare Other | Attending: Internal Medicine | Admitting: Internal Medicine

## 2016-06-01 ENCOUNTER — Emergency Department (HOSPITAL_COMMUNITY): Payer: Medicare Other

## 2016-06-01 ENCOUNTER — Encounter (HOSPITAL_COMMUNITY): Payer: Self-pay | Admitting: Emergency Medicine

## 2016-06-01 DIAGNOSIS — Z955 Presence of coronary angioplasty implant and graft: Secondary | ICD-10-CM | POA: Diagnosis not present

## 2016-06-01 DIAGNOSIS — R079 Chest pain, unspecified: Secondary | ICD-10-CM | POA: Diagnosis present

## 2016-06-01 DIAGNOSIS — Z6841 Body Mass Index (BMI) 40.0 and over, adult: Secondary | ICD-10-CM | POA: Diagnosis not present

## 2016-06-01 DIAGNOSIS — I352 Nonrheumatic aortic (valve) stenosis with insufficiency: Secondary | ICD-10-CM | POA: Diagnosis not present

## 2016-06-01 DIAGNOSIS — Z96652 Presence of left artificial knee joint: Secondary | ICD-10-CM | POA: Diagnosis present

## 2016-06-01 DIAGNOSIS — I10 Essential (primary) hypertension: Secondary | ICD-10-CM | POA: Diagnosis not present

## 2016-06-01 DIAGNOSIS — I110 Hypertensive heart disease with heart failure: Secondary | ICD-10-CM | POA: Diagnosis present

## 2016-06-01 DIAGNOSIS — J449 Chronic obstructive pulmonary disease, unspecified: Secondary | ICD-10-CM | POA: Diagnosis not present

## 2016-06-01 DIAGNOSIS — E7800 Pure hypercholesterolemia, unspecified: Secondary | ICD-10-CM | POA: Diagnosis not present

## 2016-06-01 DIAGNOSIS — E871 Hypo-osmolality and hyponatremia: Secondary | ICD-10-CM | POA: Diagnosis present

## 2016-06-01 DIAGNOSIS — K5730 Diverticulosis of large intestine without perforation or abscess without bleeding: Secondary | ICD-10-CM | POA: Diagnosis not present

## 2016-06-01 DIAGNOSIS — I252 Old myocardial infarction: Secondary | ICD-10-CM | POA: Diagnosis not present

## 2016-06-01 DIAGNOSIS — I471 Supraventricular tachycardia: Secondary | ICD-10-CM | POA: Diagnosis not present

## 2016-06-01 DIAGNOSIS — I200 Unstable angina: Secondary | ICD-10-CM | POA: Diagnosis not present

## 2016-06-01 DIAGNOSIS — Z791 Long term (current) use of non-steroidal anti-inflammatories (NSAID): Secondary | ICD-10-CM

## 2016-06-01 DIAGNOSIS — E785 Hyperlipidemia, unspecified: Secondary | ICD-10-CM | POA: Diagnosis not present

## 2016-06-01 DIAGNOSIS — Z7902 Long term (current) use of antithrombotics/antiplatelets: Secondary | ICD-10-CM

## 2016-06-01 DIAGNOSIS — E039 Hypothyroidism, unspecified: Secondary | ICD-10-CM | POA: Diagnosis present

## 2016-06-01 DIAGNOSIS — E782 Mixed hyperlipidemia: Secondary | ICD-10-CM | POA: Diagnosis not present

## 2016-06-01 DIAGNOSIS — I5032 Chronic diastolic (congestive) heart failure: Secondary | ICD-10-CM | POA: Diagnosis not present

## 2016-06-01 DIAGNOSIS — I25110 Atherosclerotic heart disease of native coronary artery with unstable angina pectoris: Principal | ICD-10-CM | POA: Diagnosis present

## 2016-06-01 DIAGNOSIS — Z7982 Long term (current) use of aspirin: Secondary | ICD-10-CM

## 2016-06-01 DIAGNOSIS — I4719 Other supraventricular tachycardia: Secondary | ICD-10-CM

## 2016-06-01 DIAGNOSIS — I359 Nonrheumatic aortic valve disorder, unspecified: Secondary | ICD-10-CM | POA: Diagnosis present

## 2016-06-01 DIAGNOSIS — Z87891 Personal history of nicotine dependence: Secondary | ICD-10-CM | POA: Diagnosis not present

## 2016-06-01 DIAGNOSIS — Z90710 Acquired absence of both cervix and uterus: Secondary | ICD-10-CM

## 2016-06-01 DIAGNOSIS — Z79899 Other long term (current) drug therapy: Secondary | ICD-10-CM | POA: Diagnosis not present

## 2016-06-01 DIAGNOSIS — Z8249 Family history of ischemic heart disease and other diseases of the circulatory system: Secondary | ICD-10-CM

## 2016-06-01 DIAGNOSIS — D649 Anemia, unspecified: Secondary | ICD-10-CM | POA: Diagnosis not present

## 2016-06-01 DIAGNOSIS — R05 Cough: Secondary | ICD-10-CM | POA: Diagnosis not present

## 2016-06-01 DIAGNOSIS — K219 Gastro-esophageal reflux disease without esophagitis: Secondary | ICD-10-CM | POA: Diagnosis not present

## 2016-06-01 DIAGNOSIS — E875 Hyperkalemia: Secondary | ICD-10-CM | POA: Diagnosis present

## 2016-06-01 DIAGNOSIS — R0789 Other chest pain: Secondary | ICD-10-CM | POA: Diagnosis not present

## 2016-06-01 HISTORY — DX: Essential (primary) hypertension: I10

## 2016-06-01 HISTORY — DX: Personal history of other diseases of the digestive system: Z87.19

## 2016-06-01 HISTORY — DX: Hyperlipidemia, unspecified: E78.5

## 2016-06-01 HISTORY — DX: Personal history of diseases of the blood and blood-forming organs and certain disorders involving the immune mechanism: Z86.2

## 2016-06-01 HISTORY — DX: Unspecified osteoarthritis, unspecified site: M19.90

## 2016-06-01 LAB — TROPONIN I
Troponin I: 0.03 ng/mL (ref <0.03)
Troponin I: 0.07 ng/mL (ref <0.03)
Troponin I: <0.03 ng/mL (ref <0.03)

## 2016-06-01 LAB — HEPARIN LEVEL (UNFRACTIONATED)
HEPARIN UNFRACTIONATED: 0.24 [IU]/mL — AB (ref 0.30–0.70)
Heparin Unfractionated: 0.15 IU/mL — ABNORMAL LOW (ref 0.30–0.70)

## 2016-06-01 LAB — URIC ACID: Uric Acid, Serum: 3.2 mg/dL (ref 2.3–6.6)

## 2016-06-01 LAB — CBC
HEMATOCRIT: 29.6 % — AB (ref 36.0–46.0)
Hemoglobin: 10.2 g/dL — ABNORMAL LOW (ref 12.0–15.0)
MCH: 31.2 pg (ref 26.0–34.0)
MCHC: 34.5 g/dL (ref 30.0–36.0)
MCV: 90.5 fL (ref 78.0–100.0)
Platelets: 292 10*3/uL (ref 150–400)
RBC: 3.27 MIL/uL — ABNORMAL LOW (ref 3.87–5.11)
RDW: 14.7 % (ref 11.5–15.5)
WBC: 7.5 10*3/uL (ref 4.0–10.5)

## 2016-06-01 LAB — FERRITIN: Ferritin: 31 ng/mL (ref 11–307)

## 2016-06-01 LAB — I-STAT TROPONIN, ED: Troponin i, poc: 0.01 ng/mL (ref 0.00–0.08)

## 2016-06-01 LAB — BASIC METABOLIC PANEL
ANION GAP: 10 (ref 5–15)
BUN: 15 mg/dL (ref 6–20)
CALCIUM: 9.2 mg/dL (ref 8.9–10.3)
CO2: 24 mmol/L (ref 22–32)
Chloride: 90 mmol/L — ABNORMAL LOW (ref 101–111)
Creatinine, Ser: 0.86 mg/dL (ref 0.44–1.00)
GFR calc Af Amer: >60 mL/min (ref >60)
GFR calc non Af Amer: >60 mL/min (ref >60)
GLUCOSE: 84 mg/dL (ref 65–99)
POTASSIUM: 3.5 mmol/L (ref 3.5–5.1)
Sodium: 124 mmol/L — ABNORMAL LOW (ref 135–145)

## 2016-06-01 LAB — I-STAT CHEM 8, ED
BUN: 15 mg/dL (ref 6–20)
CHLORIDE: 92 mmol/L — AB (ref 101–111)
CREATININE: 0.8 mg/dL (ref 0.44–1.00)
Calcium, Ion: 1.04 mmol/L — ABNORMAL LOW (ref 1.15–1.40)
GLUCOSE: 81 mg/dL (ref 65–99)
HCT: 28 % — ABNORMAL LOW (ref 36.0–46.0)
Hemoglobin: 9.5 g/dL — ABNORMAL LOW (ref 12.0–15.0)
POTASSIUM: 3.7 mmol/L (ref 3.5–5.1)
Sodium: 126 mmol/L — ABNORMAL LOW (ref 135–145)
TCO2: 26 mmol/L (ref 0–100)

## 2016-06-01 LAB — IRON AND TIBC
IRON: 156 ug/dL (ref 28–170)
Saturation Ratios: 40 % — ABNORMAL HIGH (ref 10.4–31.8)
TIBC: 393 ug/dL (ref 250–450)
UIBC: 237 ug/dL

## 2016-06-01 LAB — RETICULOCYTES
RBC.: 4.25 MIL/uL (ref 3.87–5.11)
RETIC COUNT ABSOLUTE: 131.8 10*3/uL (ref 19.0–186.0)
Retic Ct Pct: 3.1 % (ref 0.4–3.1)

## 2016-06-01 LAB — VITAMIN B12: VITAMIN B 12: 394 pg/mL (ref 180–914)

## 2016-06-01 LAB — MRSA PCR SCREENING: MRSA BY PCR: NEGATIVE

## 2016-06-01 LAB — PROTIME-INR
INR: 1.03
Prothrombin Time: 13.5 seconds (ref 11.4–15.2)

## 2016-06-01 LAB — TSH: TSH: 4.675 u[IU]/mL — AB (ref 0.350–4.500)

## 2016-06-01 LAB — OSMOLALITY: OSMOLALITY: 260 mosm/kg — AB (ref 275–295)

## 2016-06-01 LAB — FOLATE: FOLATE: 17.3 ng/mL (ref >5.9)

## 2016-06-01 LAB — APTT: aPTT: 32 seconds (ref 24–36)

## 2016-06-01 MED ORDER — ATORVASTATIN CALCIUM 80 MG PO TABS
80.0000 mg | ORAL_TABLET | Freq: Every evening | ORAL | Status: DC
  Administered 2016-06-01 – 2016-06-02 (×2): 80 mg via ORAL
  Filled 2016-06-01 (×2): qty 1

## 2016-06-01 MED ORDER — ONDANSETRON HCL 4 MG/2ML IJ SOLN
4.0000 mg | Freq: Four times a day (QID) | INTRAMUSCULAR | Status: DC | PRN
  Administered 2016-06-01: 4 mg via INTRAVENOUS
  Filled 2016-06-01: qty 2

## 2016-06-01 MED ORDER — ONDANSETRON HCL 4 MG/2ML IJ SOLN
4.0000 mg | Freq: Once | INTRAMUSCULAR | Status: AC
  Administered 2016-06-01: 4 mg via INTRAVENOUS
  Filled 2016-06-01: qty 2

## 2016-06-01 MED ORDER — ACETAMINOPHEN 325 MG PO TABS
650.0000 mg | ORAL_TABLET | Freq: Four times a day (QID) | ORAL | Status: DC | PRN
  Administered 2016-06-01 – 2016-06-02 (×3): 650 mg via ORAL
  Filled 2016-06-01 (×3): qty 2

## 2016-06-01 MED ORDER — NITROGLYCERIN IN D5W 200-5 MCG/ML-% IV SOLN
5.0000 ug/min | Freq: Once | INTRAVENOUS | Status: AC
  Administered 2016-06-01: 5 ug/min via INTRAVENOUS
  Filled 2016-06-01: qty 250

## 2016-06-01 MED ORDER — HEPARIN (PORCINE) IN NACL 100-0.45 UNIT/ML-% IJ SOLN
1300.0000 [IU]/h | INTRAMUSCULAR | Status: DC
  Administered 2016-06-01: 900 [IU]/h via INTRAVENOUS
  Administered 2016-06-02: 1300 [IU]/h via INTRAVENOUS
  Filled 2016-06-01 (×2): qty 250

## 2016-06-01 MED ORDER — MORPHINE SULFATE (PF) 4 MG/ML IV SOLN
4.0000 mg | Freq: Once | INTRAVENOUS | Status: AC
  Administered 2016-06-01: 4 mg via INTRAVENOUS
  Filled 2016-06-01: qty 1

## 2016-06-01 MED ORDER — DILTIAZEM HCL ER COATED BEADS 120 MG PO CP24: 120.0000 mg | ORAL_CAPSULE | Freq: Every day | ORAL | Status: DC

## 2016-06-01 MED ORDER — TRAZODONE HCL 50 MG PO TABS
100.0000 mg | ORAL_TABLET | Freq: Every day | ORAL | Status: DC
  Administered 2016-06-01 – 2016-06-02 (×2): 100 mg via ORAL
  Filled 2016-06-01 (×2): qty 2

## 2016-06-01 MED ORDER — DILTIAZEM HCL ER COATED BEADS 180 MG PO CP24
180.0000 mg | ORAL_CAPSULE | Freq: Every day | ORAL | Status: DC
  Administered 2016-06-02 – 2016-06-03 (×2): 180 mg via ORAL
  Filled 2016-06-01 (×4): qty 1

## 2016-06-01 MED ORDER — POTASSIUM CHLORIDE CRYS ER 10 MEQ PO TBCR
10.0000 meq | EXTENDED_RELEASE_TABLET | Freq: Every day | ORAL | Status: DC
  Administered 2016-06-01 – 2016-06-02 (×2): 10 meq via ORAL
  Filled 2016-06-01 (×2): qty 1

## 2016-06-01 MED ORDER — ASPIRIN EC 81 MG PO TBEC
81.0000 mg | DELAYED_RELEASE_TABLET | Freq: Every day | ORAL | Status: DC
  Administered 2016-06-01 – 2016-06-03 (×3): 81 mg via ORAL
  Filled 2016-06-01 (×3): qty 1

## 2016-06-01 MED ORDER — SODIUM CHLORIDE 0.9 % IV SOLN
Freq: Once | INTRAVENOUS | Status: AC
  Administered 2016-06-01: 06:00:00 via INTRAVENOUS

## 2016-06-01 MED ORDER — DULOXETINE HCL 30 MG PO CPEP
30.0000 mg | ORAL_CAPSULE | Freq: Two times a day (BID) | ORAL | Status: DC
  Administered 2016-06-01 – 2016-06-03 (×5): 30 mg via ORAL
  Filled 2016-06-01 (×6): qty 1

## 2016-06-01 MED ORDER — HEPARIN BOLUS VIA INFUSION
4000.0000 [IU] | Freq: Once | INTRAVENOUS | Status: AC
  Administered 2016-06-01: 4000 [IU] via INTRAVENOUS

## 2016-06-01 MED ORDER — BUPROPION HCL ER (XL) 150 MG PO TB24
150.0000 mg | ORAL_TABLET | Freq: Every day | ORAL | Status: DC
  Administered 2016-06-01 – 2016-06-03 (×3): 150 mg via ORAL
  Filled 2016-06-01 (×3): qty 1

## 2016-06-01 MED ORDER — GABAPENTIN 300 MG PO CAPS
300.0000 mg | ORAL_CAPSULE | Freq: Three times a day (TID) | ORAL | Status: DC
  Administered 2016-06-01 – 2016-06-03 (×5): 300 mg via ORAL
  Filled 2016-06-01 (×5): qty 1

## 2016-06-01 MED ORDER — BENAZEPRIL HCL 20 MG PO TABS
40.0000 mg | ORAL_TABLET | Freq: Every day | ORAL | Status: DC
  Administered 2016-06-01 – 2016-06-03 (×3): 40 mg via ORAL
  Filled 2016-06-01 (×2): qty 4
  Filled 2016-06-01: qty 2

## 2016-06-01 MED ORDER — NITROGLYCERIN 2 % TD OINT
1.0000 [in_us] | TOPICAL_OINTMENT | Freq: Once | TRANSDERMAL | Status: AC
  Administered 2016-06-01: 1 [in_us] via TOPICAL
  Filled 2016-06-01: qty 1

## 2016-06-01 MED ORDER — ALBUTEROL SULFATE (2.5 MG/3ML) 0.083% IN NEBU: 2.5000 mg | INHALATION_SOLUTION | Freq: Four times a day (QID) | RESPIRATORY_TRACT | Status: DC | PRN

## 2016-06-01 MED ORDER — ONDANSETRON HCL 4 MG PO TABS
4.0000 mg | ORAL_TABLET | Freq: Four times a day (QID) | ORAL | Status: DC | PRN
  Administered 2016-06-01: 4 mg via ORAL
  Filled 2016-06-01: qty 1

## 2016-06-01 MED ORDER — NITROGLYCERIN IN D5W 200-5 MCG/ML-% IV SOLN
5.0000 ug/min | INTRAVENOUS | Status: DC
  Administered 2016-06-01 (×2): 5 ug/min via INTRAVENOUS

## 2016-06-01 MED ORDER — PANTOPRAZOLE SODIUM 40 MG PO TBEC
40.0000 mg | DELAYED_RELEASE_TABLET | Freq: Every day | ORAL | Status: DC
  Administered 2016-06-01 – 2016-06-02 (×2): 40 mg via ORAL
  Filled 2016-06-01 (×2): qty 1

## 2016-06-01 MED ORDER — CLOPIDOGREL BISULFATE 75 MG PO TABS
75.0000 mg | ORAL_TABLET | Freq: Every day | ORAL | Status: DC
  Administered 2016-06-01 – 2016-06-03 (×3): 75 mg via ORAL
  Filled 2016-06-01 (×3): qty 1

## 2016-06-01 MED ORDER — IOPAMIDOL (ISOVUE-370) INJECTION 76%
100.0000 mL | Freq: Once | INTRAVENOUS | Status: AC | PRN
  Administered 2016-06-01: 100 mL via INTRAVENOUS

## 2016-06-01 MED ORDER — MORPHINE SULFATE (PF) 2 MG/ML IV SOLN
2.0000 mg | INTRAVENOUS | Status: DC | PRN
  Administered 2016-06-01 – 2016-06-02 (×3): 2 mg via INTRAVENOUS
  Filled 2016-06-01 (×4): qty 1

## 2016-06-01 MED ORDER — LEVOTHYROXINE SODIUM 50 MCG PO TABS
50.0000 ug | ORAL_TABLET | Freq: Every day | ORAL | Status: DC
  Administered 2016-06-02: 50 ug via ORAL
  Filled 2016-06-01 (×2): qty 1

## 2016-06-01 MED ORDER — ASPIRIN 81 MG PO CHEW
162.0000 mg | CHEWABLE_TABLET | Freq: Once | ORAL | Status: AC
  Administered 2016-06-01: 162 mg via ORAL
  Filled 2016-06-01: qty 2

## 2016-06-01 MED ORDER — NITROGLYCERIN 2 % TD OINT
1.0000 [in_us] | TOPICAL_OINTMENT | Freq: Four times a day (QID) | TRANSDERMAL | Status: DC
  Filled 2016-06-01: qty 30

## 2016-06-01 MED ORDER — RANOLAZINE ER 500 MG PO TB12
500.0000 mg | ORAL_TABLET | Freq: Two times a day (BID) | ORAL | Status: DC
  Administered 2016-06-01 – 2016-06-03 (×5): 500 mg via ORAL
  Filled 2016-06-01 (×6): qty 1

## 2016-06-01 MED ORDER — LEVOTHYROXINE SODIUM 50 MCG PO TABS: 50.0000 ug | ORAL_TABLET | Freq: Every day | ORAL | Status: DC

## 2016-06-01 NOTE — ED Provider Notes (Signed)
[facility]-EMERGENCY DEPT Provider Note   CSN: [id number] Arrival date & time: 06/01/16  0002   By signing my name below, I, Talbert Nan, attest that this documentation has been prepared under the direction and in the presence of Glynn Octave, MD. Electronically Signed: Talbert Nan, Scribe. 06/01/16. 12:35 AM.    History   Chief Complaint Chief Complaint  Patient presents with  . Chest Pain    HPI [name] is a 66 y.o. female with h/o CAD, CHF, COPD, NSTEMI,  brought in by ambulance, who presents to the Emergency Department complaining of moderate, constant, smothering chest pain that began at 1030pm. Pain went from chest to left arm and then to her back that felt like a stabbing. Pt was laying down watching tv when it began. Pt has associated SOB, back pain, abdominal pain. Pt has 8 stents in heart. This pain feels like her previous  Heart pain. Pt took 3 SL nitro at home without relief. Pt took 2 81 mg ASA PTA. Pt takes Plavix everyday. Pt's last stress test was in 2014. Last stent was placed in 2013. Last time pain like this was when she was having her last heart attack in 2014. Pt denies diaphoresis. Pt states that her heart has never skipped beats like this before. Pt is a nonsmoker and does not drink EtOH. Pt is not a diabetic. Pt states that she has a portacath. NKDA.   The history is provided by the patient. No language interpreter was used.    Past Medical History:  Diagnosis Date  . Abnormal chest x-ray with multiple nodules    a. 08/2011 CXR nodular density Right mid to lower chest - f/u CXR 6 wks -> CT if abnl  . Acute lower GI bleeding 12/2012   "from diverticula"  . Anemia, unspecified   . Arthritis    "knees"  . CAD (coronary artery disease)    a. 2009 PCI/DES LAD, LCX, RCA, cba OM1;  b. 08/2011 Cath: LM nl, LAD, LCX, RCA - patent stents, OM1 patent, EF 55%,-> Med Rx;  c. 02/2013 Cath: LM 30, LAD 30-40ost, patent mid stent, LCX 25p, patent mid stent, RCA 25diff,  patent prox/mid stent, EF 65%.  . Cardiac dysrhythmia, unspecified   . CHF (congestive heart failure) (HCC)   . Chronic back pain 2002   "broke my back"   . Chronic diastolic CHF (congestive heart failure) (HCC)    a. 08/2011 Echo: EF 55-65%;  b. 02/2013 Echo: EF 60-65%, some SAM of AoV w subvalvular gradient that seems more significant, rec tee or f/u echo w/ BB, mild MR, mildly dil LA  . COPD (chronic obstructive pulmonary disease) (HCC)   . DDD (degenerative disc disease)   . Gastric ulcer   . GERD (gastroesophageal reflux disease)   . AOZHYQMV(784.6)    "monthly" (03/22/2013)  . High cholesterol   . History of blood transfusion 12/2012   "2 units for lower GIB"  . HTN (hypertension)   . Hypothyroidism   . Morbid obesity (HCC)   . Osteoarthrosis, unspecified whether generalized or localized, unspecified site   . Serum hepatitis 1970    Patient Active Problem List   Diagnosis Date Noted  . Unstable angina (HCC) 03/23/2013  . Hyperlipidemia 03/23/2013  . Hypotension 03/23/2013  . NSTEMI (non-ST elevated myocardial infarction) (HCC) 03/21/2013  . Hyponatremia 10/13/2011  . AS (aortic stenosis) 10/13/2011  . Hypokalemia 10/13/2011  . Abnormal CXR 10/13/2011  . Chest pain 09/05/2011  . GERD (gastroesophageal  reflux disease) 09/05/2011  . AORTIC STENOSIS/ INSUFFICIENCY, NON-RHEUMATIC 10/15/2009  . KNEE, ARTHRITIS, DEGEN./OSTEO 03/05/2009  . ACUT GASTR ULCER W/HEMORR W/O MENTION OBST 01/19/2009  . DYSPNEA 11/06/2008  . HYPOTHYROIDISM 10/10/2008  . Morbid obesity (HCC) 10/10/2008  . UNSPECIFIED ANEMIA 10/10/2008  . Essential hypertension 10/10/2008  . CAD 10/10/2008  . CEREBROVASCULAR DISEASE 10/10/2008  . OSTEOARTHRITIS, MILD 10/10/2008  . BACK PAIN 10/10/2008  . HEADACHE 10/10/2008    Past Surgical History:  Procedure Laterality Date  . CARDIAC CATHETERIZATION  08/19/2012  . CESAREAN SECTION  1971  . CORONARY ANGIOPLASTY WITH STENT PLACEMENT     mid lad (promus drug  eluting stent) 2009. cutting balloon angioplasty first obtuse marginal. three overlapping stents in the RCA- promus DES x3  . DILATION AND CURETTAGE OF UTERUS  1970   "6 month fetus; abruptio placenta"  . JOINT REPLACEMENT    . LEFT HEART CATHETERIZATION WITH CORONARY ANGIOGRAM N/A 09/04/2011   Procedure: LEFT HEART CATHETERIZATION WITH CORONARY ANGIOGRAM;  Surgeon: Kathleene Hazelhristopher D McAlhany, MD;  [facility];  Service: Cardiovascular;  Laterality: N/A;  . LEFT HEART CATHETERIZATION WITH CORONARY ANGIOGRAM N/A 03/21/2013   Procedure: LEFT HEART CATHETERIZATION WITH CORONARY ANGIOGRAM;  Surgeon: Rollene RotundaJames Hochrein, MD;  [facility];  Service: Cardiovascular;  Laterality: N/A;  . SHOULDER OPEN ROTATOR CUFF REPAIR Left 2010  . TOTAL KNEE ARTHROPLASTY Left 2011  . TUBAL LIGATION  1985  . VAGINAL HYSTERECTOMY  1988   "cut out 3/4th of my bladder accidentally" (03/22/2013)    OB History    No data available       Home Medications    Prior to Admission medications   Medication Sig Start Date End Date Taking? Authorizing Provider  albuterol (PROVENTIL) (2.5 MG/3ML) 0.083% nebulizer solution Take 2.5 mg by nebulization every 6 (six) hours as needed for wheezing or shortness of breath.    Historical Provider, MD  aspirin EC 81 MG tablet Take 81 mg by mouth daily.    Historical Provider, MD  atorvastatin (LIPITOR) 80 MG tablet Take 1 tablet (80 mg total) by mouth daily. 02/23/15   Laqueta LindenSuresh A Koneswaran, MD  benazepril (LOTENSIN) 40 MG tablet Take 1 tablet by mouth daily. 04/21/13   Historical Provider, MD  buPROPion (WELLBUTRIN XL) 150 MG 24 hr tablet Take 150 mg by mouth daily.  01/28/13   Historical Provider, MD  clopidogrel (PLAVIX) 75 MG tablet Take 1 tablet (75 mg total) by mouth daily. 02/28/15   Laqueta LindenSuresh A Koneswaran, MD  diltiazem (CARDIZEM CD) 120 MG 24 hr capsule Take 1 capsule by mouth daily. 06/05/15   Historical Provider, MD  diphenhydrAMINE (BENADRYL) 25 MG tablet Take 25 mg by  mouth 2 (two) times daily.    Historical Provider, MD  docusate sodium (COLACE) 100 MG capsule Take 100 mg by mouth daily as needed for mild constipation.    Historical Provider, MD  DULoxetine (CYMBALTA) 30 MG capsule Take 30 mg by mouth 2 (two) times daily.    Historical Provider, MD  gabapentin (NEURONTIN) 300 MG capsule Take 1 capsule by mouth 3 (three) times daily. 07/12/14   Historical Provider, MD  gentamicin (GARAMYCIN) 0.3 % ophthalmic solution Place 1 drop into both eyes every 8 (eight) hours. 08/15/15   Historical Provider, MD  isosorbide mononitrate (IMDUR) 60 MG 24 hr tablet Take 1 tablet by mouth  daily 11/05/15   Laqueta LindenSuresh A Koneswaran, MD  levothyroxine (SYNTHROID, LEVOTHROID) 50 MCG tablet Take 1 tablet (50 mcg total) by mouth  daily. 08/26/13   Laqueta Linden, MD  meloxicam (MOBIC) 15 MG tablet Take 15 mg by mouth daily.    Historical Provider, MD  methocarbamol (ROBAXIN) 750 MG tablet Take 750 mg by mouth 4 (four) times daily.    Historical Provider, MD  nitroGLYCERIN (NITROSTAT) 0.4 MG SL tablet Place 1 tablet (0.4 mg total) under the tongue every 5 (five) minutes as needed for chest pain. 08/17/15   Laqueta Linden, MD  potassium chloride (K-DUR,KLOR-CON) 10 MEQ tablet Take 1 tablet by mouth  daily 11/05/15   Laqueta Linden, MD  promethazine (PHENERGAN) 25 MG tablet Take 25 mg by mouth every 6 (six) hours as needed for nausea or vomiting.    Historical Provider, MD  ranitidine (ZANTAC) 300 MG tablet Take 1 tablet by mouth 2 (two) times daily. 07/20/15   Historical Provider, MD  ranolazine (RANEXA) 500 MG 12 hr tablet Take 1 tablet (500 mg total) by mouth 2 (two) times daily. 08/23/15   Laqueta Linden, MD  traZODone (DESYREL) 100 MG tablet Take 1 tablet by mouth daily. 07/02/15   Historical Provider, MD    Family History Family History  Problem Relation Age of Onset  . Heart attack Other     female <55  . Cancer Other   . Diabetes Other   . Arthritis Other     Social  History Social History  Substance Use Topics  . Smoking status: Former Smoker    Packs/day: 0.50    Years: 40.00    Types: Cigarettes    Start date: 03/24/1966    Quit date: 04/24/2008  . Smokeless tobacco: Never Used  . Alcohol use No     Allergies   Patient has no known allergies.   Review of Systems Review of Systems  Cardiovascular: Positive for chest pain.   A complete 10 system review of systems was obtained and all systems are negative except as noted in the HPI and PMH.    Physical Exam Updated Vital Signs BP 158/89 (BP Location: Left Arm)   Pulse 82   Temp 98.6 F (37 C) (Oral)   Resp 20   Ht 5' (1.524 m)   Wt 221 lb (100.2 kg)   SpO2 98%   BMI 43.16 kg/m   Physical Exam  Constitutional: She is oriented to person, place, and time. She appears well-developed and well-nourished. No distress.  No distress.  HENT:  Head: Normocephalic and atraumatic.  Mouth/Throat: Oropharynx is clear and moist. No oropharyngeal exudate.  Eyes: Conjunctivae and EOM are normal. Pupils are equal, round, and reactive to light.  Neck: Normal range of motion. Neck supple.  No meningismus.  Cardiovascular: Normal rate, regular rhythm, normal heart sounds and intact distal pulses.   No murmur heard. Decreased left radial pulse. Equal DP pulses.  Pulmonary/Chest: Effort normal and breath sounds normal. No respiratory distress.  Equal breath sounds.  Abdominal: Soft. There is no tenderness. There is no rebound and no guarding.  Musculoskeletal: Normal range of motion. She exhibits no edema or tenderness.  Neurological: She is alert and oriented to person, place, and time. No cranial nerve deficit. She exhibits normal muscle tone. Coordination normal.   5/5 strength throughout. CN 2-12 intact.Equal grip strength.   Skin: Skin is warm.  Psychiatric: She has a normal mood and affect. Her behavior is normal.  Nursing note and vitals reviewed.    ED Treatments / Results   DIAGNOSTIC  STUDIES: Oxygen Saturation is 98% on room air,  normal by my interpretation.    COORDINATION OF CARE: 12:30 AM Discussed treatment plan with pt at bedside and pt agreed to plan, which includes blood work, Xr, EKG, CT scan.    Labs (all labs ordered are listed, but only abnormal results are displayed) Labs Reviewed  BASIC METABOLIC PANEL - Abnormal; Notable for the following:       Result Value   Sodium 124 (*)    Chloride 90 (*)    All other components within normal limits  CBC - Abnormal; Notable for the following:    RBC 3.27 (*)    Hemoglobin 10.2 (*)    HCT 29.6 (*)    All other components within normal limits  I-STAT CHEM 8, ED - Abnormal; Notable for the following:    Sodium 126 (*)    Chloride 92 (*)    Calcium, Ion 1.04 (*)    Hemoglobin 9.5 (*)    HCT 28.0 (*)    All other components within normal limits  MRSA PCR SCREENING  PROTIME-INR  TROPONIN I  APTT  OSMOLALITY  HEPARIN LEVEL (UNFRACTIONATED)  I-STAT TROPOININ, ED  I-STAT TROPOININ, ED    EKG  EKG Interpretation  Date/Time:  Sunday June 01 2016 00:18:30 EST Ventricular Rate:  76 PR Interval:    QRS Duration: 144 QT Interval:  385 QTC Calculation: 433 R Axis:   28 Text Interpretation:  Sinus arrhythmia Right bundle branch block No significant change was found Confirmed by Manus Gunning  MD, Gladine Plude [phone]) on 06/01/2016 12:32:18 AM       Radiology Dg Chest Portable 1 View  Result Date: 06/01/2016 CLINICAL DATA:  Nonproductive cough for 3 days. Left-sided chest pain tonight, radiating into the left arm. Dyspnea. EXAM: PORTABLE CHEST 1 VIEW COMPARISON:  09/05/2011 FINDINGS: Right-sided central line extends into the low SVC. The lungs are clear. Pulmonary vasculature is normal. No large effusions. Normal heart size. IMPRESSION: No acute cardiopulmonary findings. Electronically Signed   By: Ellery Plunk M.D.   On: 06/01/2016 03:35   Ct Angio Chest/abd/pel For Dissection W And/or Wo Contrast  Result  Date: 06/01/2016 CLINICAL DATA:  Constant chest pain, onset at 22:30. Radiating into the left arm and penetrating through to the back. Onset at rest. Dyspnea. EXAM: CT ANGIOGRAPHY CHEST, ABDOMEN AND PELVIS TECHNIQUE: Multidetector CT imaging through the chest, abdomen and pelvis was performed using the standard protocol during bolus administration of intravenous contrast. Multiplanar reconstructed images and MIPs were obtained and reviewed to evaluate the vascular anatomy. CONTRAST:  100 mL Isovue 370 intravenous COMPARISON:  Radiographs 06/01/2016 FINDINGS: CTA CHEST FINDINGS Cardiovascular: Preferential opacification of the thoracic aorta. No evidence of thoracic aortic aneurysm or dissection. Normal heart size. No pericardial effusion. Good opacification of the pulmonary arteries. No embolism. Moderate atherosclerotic calcification of the coronary arteries and great vessel origins. Mediastinum/Nodes: No enlarged mediastinal, hilar, or axillary lymph nodes. Thyroid gland, trachea, and esophagus demonstrate no significant findings. Lungs/Pleura: Lungs are clear. No pleural effusion or pneumothorax. Musculoskeletal: No chest wall abnormality. No acute or significant osseous findings. Review of the MIP images confirms the above findings. CTA ABDOMEN AND PELVIS FINDINGS VASCULAR Aorta: Normal caliber aorta without aneurysm, dissection, vasculitis or significant stenosis. Celiac: Patent without evidence of aneurysm, dissection, vasculitis or significant stenosis. SMA: Patent without evidence of aneurysm, dissection, vasculitis or significant stenosis. Renals: Left renal artery is patent. Probable stenosis of the right renal artery in its proximal 1st cm. IMA: Patent without evidence of aneurysm, dissection, vasculitis or significant stenosis.  Inflow: Distal aorta and common iliac arteries are heavily calcified but patent. External iliac and common femoral arteries are patent. Femoral bifurcations are patent. Veins: No  obvious venous abnormality within the limitations of this arterial phase study. Review of the MIP images confirms the above findings. NON-VASCULAR Hepatobiliary: No focal liver abnormality is seen. No gallstones, gallbladder wall thickening, or biliary dilatation. Pancreas: Unremarkable. No pancreatic ductal dilatation or surrounding inflammatory changes. Spleen: Normal in size without focal abnormality. Adrenals/Urinary Tract: Adrenal glands are unremarkable. Kidneys are normal, without renal calculi, focal lesion, or hydronephrosis. Bladder is unremarkable. Stomach/Bowel: Stomach is within normal limits. Appendix is normal. Moderate colonic diverticulosis. No evidence of bowel wall thickening, distention, or inflammatory changes. Lymphatic: No significant vascular findings are present. No enlarged abdominal or pelvic lymph nodes. Reproductive: Status post hysterectomy. No adnexal masses. Other: No focal inflammation.  No ascites. Musculoskeletal: No significant skeletal lead Review of the MIP images confirms the above findings. IMPRESSION: 1. No acute vascular abnormality. 2. Moderate atherosclerotic calcification of the aorta and coronary arteries. 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 3. Colonic diverticulosis. Electronically Signed   By: Ellery Plunk M.D.   On: 06/01/2016 05:10    Procedures Procedures (including critical care time)  Medications Ordered in ED Medications - No data to display   Initial Impression / Assessment and Plan / ED Course  I have reviewed the triage vital signs and the nursing notes.  Pertinent labs & imaging results that were available during my care of the patient were reviewed by me and considered in my medical decision making (see chart for details).     Patient with history of CAD in multiple stents presenting with central chest pain rating to her left arm, back and abdomen. Took nitroglycerin at home without relief. Her EKG shows no acute ST  changes. She does have some sinus arrhythmia. Patent stents on last cath in 2014  Patient given aspirin and Nitropaste. Also morphine. Labs obtained chest x-ray obtained.  With decreased pulse in left arm and pain radiating to the back and abdomen. We'll rule out aortic dissection. Troponin negative. New hyponatremia.  CTA negative for dissection or PE.  Pain persists despite NTG and morphine.  Will start IV NTG.  D/w cardiology Dr. Mayford Knife who agrees with transfer to [facility] on medical service.   D/w Dr Sharl Ma who will arrange transfer.  Troponins remain negative.  Continue IV NTG and IV heparin.  CRITICAL CARE Performed by: Glynn Octave Total critical care time: 45 minutes Critical care time was exclusive of separately billable procedures and treating other patients. Critical care was necessary to treat or prevent imminent or life-threatening deterioration. Critical care was time spent personally by me on the following activities: development of treatment plan with patient and/or surrogate as well as nursing, discussions with consultants, evaluation of patient's response to treatment, examination of patient, obtaining history from patient or surrogate, ordering and performing treatments and interventions, ordering and review of laboratory studies, ordering and review of radiographic studies, pulse oximetry and re-evaluation of patient's condition.   Final Clinical Impressions(s) / ED Diagnoses   Final diagnoses:  Unstable angina (HCC)    New Prescriptions New Prescriptions   No medications on file  I personally performed the services described in this documentation, which was scribed in my presence. The recorded information has been reviewed and is accurate.     Glynn Octave, MD 06/01/16 [phone]

## 2016-06-01 NOTE — ED Notes (Signed)
[facility] left with pt 

## 2016-06-01 NOTE — Progress Notes (Signed)
ANTICOAGULATION CONSULT NOTE - Preliminary  Pharmacy Consult for Heparin Indication: ACS/STEMI  No Known Allergies  Patient Measurements: Height: 5' (152.4 cm) Weight: 221 lb (100.2 kg) IBW/kg (Calculated) : 45.5 HEPARIN DW (KG): 69.9   Vital Signs: Temp: 98.6 F (37 C) (03/11 0017) Temp Source: Oral (03/11 0017) BP: 116/81 (03/11 0331) Pulse Rate: 84 (03/11 0331)  Labs:  Recent Labs  06/01/16 0134 06/01/16 0412  HGB 10.2* 9.5*  HCT 29.6* 28.0*  PLT 292  --   LABPROT 13.5  --   INR 1.03  --   CREATININE 0.86 0.80   Estimated Creatinine Clearance: 75.6 mL/min (by C-G formula based on SCr of 0.8 mg/dL).  Medical History: Past Medical History:  Diagnosis Date  . Abnormal chest x-ray with multiple nodules    a. 08/2011 CXR nodular density Right mid to lower chest - f/u CXR 6 wks -> CT if abnl  . Acute lower GI bleeding 12/2012   "from diverticula"  . Anemia, unspecified   . Arthritis    "knees"  . CAD (coronary artery disease)    a. 2009 PCI/DES LAD, LCX, RCA, cba OM1;  b. 08/2011 Cath: LM nl, LAD, LCX, RCA - patent stents, OM1 patent, EF 55%,-> Med Rx;  c. 02/2013 Cath: LM 30, LAD 30-40ost, patent mid stent, LCX 25p, patent mid stent, RCA 25diff, patent prox/mid stent, EF 65%.  . Cardiac dysrhythmia, unspecified   . CHF (congestive heart failure) (HCC)   . Chronic back pain 2002   "broke my back"   . Chronic diastolic CHF (congestive heart failure) (HCC)    a. 08/2011 Echo: EF 55-65%;  b. 02/2013 Echo: EF 60-65%, some SAM of AoV w subvalvular gradient that seems more significant, rec tee or f/u echo w/ BB, mild MR, mildly dil LA  . COPD (chronic obstructive pulmonary disease) (HCC)   . DDD (degenerative disc disease)   . Gastric ulcer   . GERD (gastroesophageal reflux disease)   . ZOXWRUEA(540.9Headache(784.0)    "monthly" (03/22/2013)  . High cholesterol   . History of blood transfusion 12/2012   "2 units for lower GIB"  . HTN (hypertension)   . Hypothyroidism   . Morbid  obesity (HCC)   . Osteoarthrosis, unspecified whether generalized or localized, unspecified site   . Serum hepatitis 1970    Medications:   Assessment: 66 yo female with h/o CAD,CHF,COPD, multiple PCIs, presented to the ED with CP radiating to L arm and back unrelieved by 3 SL nitroglycerin tablets. Heparin infusion to be started per r/o ACS/STEMI per pharmacy dosing.  Goal of Therapy:  Heparin goal 0.3-0.7 units/ml Monitor platelets by anticoagulation prootcol: Yes   Plan:  Heparin 4000 unit IV bolus Heparin infusion at 900 units/hour Preliminary review of pertinent patient information completed.  Jeani HawkingAnnie Penn clinical pharmacist will complete review during morning rounds to assess the patient and finalize treatment regimen.  Arelia SneddonMason, Emannuel Vise Anne, [facility] HospitalRPH 06/01/2016,5:50 AM

## 2016-06-01 NOTE — Progress Notes (Signed)
ANTICOAGULATION CONSULT NOTE - Follow Up Consult  Pharmacy Consult for heparin Indication: chest pain/ACS  No Known Allergies  Patient Measurements: Height: 5' (152.4 cm) (patient states) Weight: 213 lb (96.6 kg) IBW/kg (Calculated) : 45.5 Heparin Dosing Weight: 69 kg  Vital Signs: Temp: 98.1 F (36.7 C) (03/11 0923) Temp Source: Oral (03/11 0923) BP: 135/107 (03/11 0923) Pulse Rate: 94 (03/11 0805)  Labs:  Recent Labs  06/01/16 0134 06/01/16 0412 06/01/16 0738 06/01/16 1041  HGB 10.2* 9.5*  --   --   HCT 29.6* 28.0*  --   --   PLT 292  --   --   --   APTT  --   --  32  --   LABPROT 13.5  --   --   --   INR 1.03  --   --   --   HEPARINUNFRC  --   --   --  0.15*  CREATININE 0.86 0.80  --   --   TROPONINI  --   --  <0.03  --     Estimated Creatinine Clearance: 73.9 mL/min (by C-G formula based on SCr of 0.8 mg/dL).   Assessment: [age] yoF w/ PMH CAD presents w/ CP to start on heparin infusion per pharmacy for ACS r/o. Initial heparin level subtherapeutic at 0.15. Per RN, heparin has been running since transfer from [facility] ED with no issues. Initial troponins negative, Hgb a little low, plt wnl.  Goal of Therapy:  Heparin level 0.3-0.7 units/ml Monitor platelets by anticoagulation protocol: Yes   Plan:  -Increase heparin to 1150 units/hr -Check 6-hr heparin level -Monitor daily heparin level, CBC, S/Sx bleeding  Fredonia HighlandMichael Kumari Sculley, PharmD PGY-1 Pharmacy Resident Pager: [phone] 06/01/2016

## 2016-06-01 NOTE — Consult Note (Signed)
Requesting provider: Dr. Meredeth Ide Primary cardiologist: Dr. Prentice Docker Consulting cardiologist: Dr. Jonelle Sidle  Reason for consultation: CP, palpitations  Clinical Summary [name] is a 66 y.o.female with past medical history outlined below, transferred from [facility] for further evaluation of recent onset chest pain. I reviewed the history and physical. She states that she has been well from a cardiac perspective, last saw Dr. Purvis Sheffield back in July 2017. Lexiscan Myoview from June 2017 was overall low risk with no ischemic defects, and description of short runs of atrial tachycardia. She has been managed medically. States that last night around 10:30 PM she began to experience a fluttering sensation in her chest and subsequently chest tightness consistent with her angina. Sublingual nitroglycerin was not effective and she went to the ER for further evaluation. She is now on intravenous heparin and nitroglycerin, reports no active chest pain. Initial cardiac markers are normal.  Cardiac history is outlined below, she has undergone DES interventions to the LAD, circumflex, and RCA, patent as of 2014. He reports compliance with medical therapy is an outpatient. Ischemic regimen at home includes Imdur, diltiazem CD, and Ranexa. She is not on beta blocker.   No Known Allergies  Medications Scheduled Medications: . aspirin EC  81 mg Oral Daily  . atorvastatin  80 mg Oral Daily  . benazepril  40 mg Oral Daily  . buPROPion  150 mg Oral Daily  . clopidogrel  75 mg Oral Daily  . [START ON 06/02/2016] diltiazem  180 mg Oral Daily  . DULoxetine  30 mg Oral BID  . gabapentin  300 mg Oral TID  . levothyroxine  50 mcg Oral Daily  . nitroGLYCERIN  1 inch Topical Q6H  . potassium chloride  10 mEq Oral Daily  . ranolazine  500 mg Oral BID  . traZODone  100 mg Oral Daily     Infusions: . heparin 900 Units/hr (06/01/16 0626)     PRN Medications:  albuterol,  morphine injection, ondansetron **OR** ondansetron (ZOFRAN) IV   Past Medical History:  Diagnosis Date  . Abnormal chest x-ray with multiple nodules    a. 08/2011 CXR nodular density Right mid to lower chest - f/u CXR 6 wks -> CT if abnl  . CAD (coronary artery disease)    a. 2009 PCI/DES LAD, LCX, RCA, cba OM1;  b. 08/2011 Cath: LM nl, LAD, LCX, RCA - patent stents, OM1 patent, EF 55%,-> Med Rx;  c. 02/2013 Cath: LM 30, LAD 30-40ost, patent mid stent, LCX 25p, patent mid stent, RCA 25diff, patent prox/mid stent, EF 65%.  . Chronic back pain 2002  . Chronic diastolic CHF (congestive heart failure) (HCC)    a. 08/2011 Echo: EF 55-65%;  b. 02/2013 Echo: EF 60-65%, some SAM of AoV w subvalvular gradient that seems more significant, rec tee or f/u echo w/ BB, mild MR, mildly dil LA  . COPD (chronic obstructive pulmonary disease) (HCC)   . DDD (degenerative disc disease)   . Essential hypertension   . Gastric ulcer   . GERD (gastroesophageal reflux disease)   . Headache(784.0)   . History of anemia   . History of blood transfusion 12/2012  . History of GI diverticular bleed 12/2012  . Hyperlipidemia   . Hypothyroidism   . Morbid obesity (HCC)   . Osteoarthritis   . Serum hepatitis 1970    Past Surgical History:  Procedure Laterality Date  . CARDIAC CATHETERIZATION  08/19/2012  . CESAREAN SECTION  1971  . CORONARY ANGIOPLASTY WITH STENT PLACEMENT     mid lad (promus drug eluting stent) 2009. cutting balloon angioplasty first obtuse marginal. three overlapping stents in the RCA- promus DES x3  . DILATION AND CURETTAGE OF UTERUS  1970   "6 month fetus; abruptio placenta"  . JOINT REPLACEMENT    . LEFT HEART CATHETERIZATION WITH CORONARY ANGIOGRAM N/A 09/04/2011   Procedure: LEFT HEART CATHETERIZATION WITH CORONARY ANGIOGRAM;  Surgeon: Kathleene Hazel, MD;  Location: [facility] CATH LAB;  Service: Cardiovascular;  Laterality: N/A;  . LEFT HEART CATHETERIZATION WITH CORONARY ANGIOGRAM N/A  03/21/2013   Procedure: LEFT HEART CATHETERIZATION WITH CORONARY ANGIOGRAM;  Surgeon: Rollene Rotunda, MD;  Location: [facility] CATH LAB;  Service: Cardiovascular;  Laterality: N/A;  . SHOULDER OPEN ROTATOR CUFF REPAIR Left 2010  . TOTAL KNEE ARTHROPLASTY Left 2011  . TUBAL LIGATION  1985  . VAGINAL HYSTERECTOMY  1988   "cut out 3/4th of my bladder accidentally" (03/22/2013)    Family History  Problem Relation Age of Onset  . CAD Father   . CAD Mother   . Heart attack Other     female <55  . Cancer Other   . Diabetes Other   . Arthritis Other     Social History Ms. Brabant reports that she quit smoking about 8 years ago. Her smoking use included Cigarettes. She started smoking about 50 years ago. She has a 20.00 pack-year smoking history. She has never used smokeless tobacco. Ms. Karney reports that she does not drink alcohol.  Review of Systems Complete review of systems negative except as otherwise outlined in the clinical summary and also the following. Feeling of palpitations was new.  Physical Examination Blood pressure (!) 135/107, pulse 94, temperature 98.1 F (36.7 C), temperature source Oral, resp. rate 20, height 5' (1.524 m), weight 213 lb (96.6 kg), SpO2 98 %. No intake or output data in the 24 hours ending 06/01/16 1043  Telemetry: Sinus rhythm with bursts of apparent atrial tachycardia.  Gen: Obese short statured woman in no distress. HEENT: Conjunctiva and lids normal, oropharynx clear. Neck: Supple, no elevated JVP or carotid bruits, no thyromegaly. Lungs: Clear to auscultation, nonlabored breathing at rest. Cardiac: Regular rate and rhythm, no S3, 2/6 systolic murmur, no pericardial rub. Abdomen: Soft, nontender, bowel sounds present, no guarding or rebound. Extremities: No pitting edema, distal pulses 2+. Skin: Warm and dry. Musculoskeletal: No kyphosis. Neuropsychiatric: Alert and oriented x3, affect grossly appropriate.   Lab Results  Basic Metabolic  Panel:  Recent Labs Lab 06/01/16 0134 06/01/16 0412  NA 124* 126*  K 3.5 3.7  CL 90* 92*  CO2 24  --   GLUCOSE 84 81  BUN 15 15  CREATININE 0.86 0.80  CALCIUM 9.2  --     CBC:  Recent Labs Lab 06/01/16 0134 06/01/16 0412  WBC 7.5  --   HGB 10.2* 9.5*  HCT 29.6* 28.0*  MCV 90.5  --   PLT 292  --     Cardiac Enzymes:  Recent Labs Lab 06/01/16 0738  TROPONINI <0.03    ECG Tracing from 06/01/2016 shows sinus rhythm with apparent bursts of atrial tachycardia and right bundle branch block. Personally reviewed.  Imaging Lexiscan Myoview 08/24/2015:  There was no ST segment deviation noted during stress. Occasional PACs and short 3-4 beat runs of atrial tachycardia  The study is normal. There are no defects consistent with prior infarct or current ischemia.  This is a low risk study.  The  left ventricular ejection fraction is hyperdynamic (>65%).  Echocardiogram 03/21/2013: Study Conclusions  - Left ventricle: Poor image quality Septal thickness not measured The cavity size was normal. Systolic function was normal. The estimated ejection fraction was in the range of 60% to 65%. - Aortic valve: Poorly visualized Small gradient across valve but appears to open well. There is some SAM and subvalvular gradient that seems more significant. Suggest TEE or f/u echo with beta blocker Rx - Mitral valve: Mild regurgitation. - Left atrium: The atrium was mildly dilated. - Atrial septum: No defect or patent foramen ovale was identified.  Impression  1. Chest pain at rest, concerning for unstable angina, but ECG without acute ST segment changes and initial troponin I levels are negative. Patient states that symptoms were also associated with a feeling of palpitations and she has had intermittent episodes of apparent atrial tachycardia on monitoring which could be leading to angina. Last cardiac catheterization was in 2014 and she had a follow-up Myoview  last year that was low risk.  2. Interval episodes of possible atrial tachycardia.  3. EGD status post DES interventions to the LAD, circumflex, and RCA, patent at angiography in 2014. Last LVEF 60-65% range.  4. Systolic murmur, last echocardiogram indicated possible subaortic valve gradient.  5. Hyperlipidemia, on Lipitor.   Recommendations  Plan to increase Cardizem CD to 180 mg daily to see if this will help with episodes of tachycardia. Continue to cycle cardiac markers to further assess trend. Still may need to consider a cardiac catheterization if she continues to have chest pain even with reassuring markers so far. Follow-up echocardiogram will be obtained to assess LVEF and also previously described subaortic valve gradient. If she is to stay on Ranexa, might want to consider switching her to a beta blocker instead of calcium channel blocker for management of atrial tachycardia.  Jonelle SidleSamuel G. McDowell, M.D., F.A.C.C.

## 2016-06-01 NOTE — Progress Notes (Addendum)
ANTICOAGULATION CONSULT NOTE - Follow Up Consult  Pharmacy Consult for heparin Indication: chest pain/ACS  Labs:  Recent Labs  06/01/16 0134 06/01/16 0412  06/01/16 0738  06/01/16 1041 06/01/16 1355 06/01/16 2000 06/01/16 2220 06/01/16 2227 06/02/16 0354  HGB 10.2* 9.5*  --   --   --   --   --   --   --   --  9.6*  HCT 29.6* 28.0*  --   --   --   --   --   --   --   --  29.4*  PLT 292  --   --   --   --   --   --   --   --   --  310  APTT  --   --   --  32  --   --   --   --   --   --   --   LABPROT 13.5  --   --   --   --   --   --   --   --   --   --   INR 1.03  --   --   --   --   --   --   --   --   --   --   HEPARINUNFRC  --   --   --   --   < > 0.15*  --  >2.20*  --  0.24* 0.43  CREATININE 0.86 0.80  --   --   --   --   --   --   --   --   --   TROPONINI  --   --   < > <0.03  --  0.03* 0.07*  --  <0.03  --   --   < > = values in this interval not displayed.   Assessment: 66yo female remains subtherapeutic on heparin after rate increase.  Goal of Therapy:  Heparin level 0.3-0.7 units/ml   Plan:  Will increase heparin gtt by 1-2 units/kg/hr to 1300 units/hr and check level with am labs.  Vernard GamblesVeronda Bronnie Vasseur, PharmD, BCPS  06/01/2016,11:18 PM    ADDENDUM: Heparin level this am at goal.  Will continue heparin gtt and confirm with additional level.     VB 06/02/2016 5:10 AM

## 2016-06-01 NOTE — ED Notes (Signed)
Pt reports pain remains at 4. Increased nitro to 10 mcg

## 2016-06-01 NOTE — Progress Notes (Addendum)
PROGRESS NOTE        PATIENT DETAILS Name: [name] Age: 66 y.o. Sex: female Date of Birth: 12/14/1950 Admit Date: 06/01/2016 Admitting Physician Meredeth IdeGagan S Lama, MD WUJ:[id number] Tillie RungA HASANAJ, MD  Brief Narrative: Patient is a 66 y.o. female with hx of CAD s/p last PCI in 2014 admitted for evaluation of chest pain and palpitations. See below for further details.   Subjective: Minimal chest pain during my evaluation.  Assessment/Plan: Unstable Angina: continues to have intermittent chest pain,await cardiac enzymes. Continue ASA,Plavix, Heparin gtt, and Nitroglycerin infusion. Cards following, with plans on further work up depending on troponin, Echo etc. CTA Chest/Abd negative for PE and dissection  Atrial Tachycardia:in and out of atrial tachycardia-cards has increased Cardizem dosing to 180 mg  Hyponatremia: euvolemic on exam-?SIADH-hold IVF-repeat lytes in am.   HTN: controlled-continue IV nitroglycerin gtt, cardizem and Benazepril  Dyslipidemia: continue Statin  Hypothyroidism- continue Synthroid-await TSH  Anemia: normocytic-follow anemia panel.  GERD:PPI  COPD: lungs clear-continue prn bronchodilators  Morbid Obesity  DVT Prophylaxis: Full dose anticoagulation with Heparin  Code Status: Full code   Family Communication: None at bedside  Disposition Plan: Remain inpatient-remain in SDU  Antimicrobial agents: Anti-infectives    None      Procedures: None  CONSULTS:  cardiology  Time spent: 25- minutes-Greater than 50% of this time was spent in counseling, explanation of diagnosis, planning of further management, and coordination of care.  MEDICATIONS: Scheduled Meds: . aspirin EC  81 mg Oral Daily  . atorvastatin  80 mg Oral Daily  . benazepril  40 mg Oral Daily  . buPROPion  150 mg Oral Daily  . clopidogrel  75 mg Oral Daily  . [START ON 06/02/2016] diltiazem  180 mg Oral Daily  . DULoxetine  30 mg Oral BID  . gabapentin   300 mg Oral TID  . levothyroxine  50 mcg Oral Daily  . nitroGLYCERIN  1 inch Topical Q6H  . potassium chloride  10 mEq Oral Daily  . ranolazine  500 mg Oral BID  . traZODone  100 mg Oral Daily   Continuous Infusions: . heparin 900 Units/hr (06/01/16 0626)   PRN Meds:.albuterol, morphine injection, ondansetron **OR** ondansetron (ZOFRAN) IV   PHYSICAL EXAM: Vital signs: Vitals:   06/01/16 0400 06/01/16 0734 06/01/16 0805 06/01/16 0923  BP: 138/82 143/89 150/95 (!) 135/107  Pulse: 75 95 94   Resp:  20 20 20   Temp:  98.1 F (36.7 C)  98.1 F (36.7 C)  TempSrc:  Oral  Oral  SpO2: 96% 96% 95% 98%  Weight:    96.6 kg (213 lb)  Height:    5' (1.524 m)   Filed Weights   06/01/16 0013 06/01/16 0923  Weight: 100.2 kg (221 lb) 96.6 kg (213 lb)   Body mass index is 41.6 kg/m.   General appearance :Awake, alert, not in any distress. Speech Clear. Eyes:, pupils equally reactive to light and accomodation,no scleral icterus. HEENT: Atraumatic and Normocephalic Neck: supple, no JVD. No cervical lymphadenopathy.  Resp:Good air entry bilaterally, no added sounds  CVS: S1 S2 regular,+syst murmur GI: Bowel sounds present, Non tender and not distended with no gaurding, rigidity or rebound.No organomegaly Extremities: B/L Lower Ext shows no edema, both legs are warm to touch Neurology:  speech clear,Non focal, sensation is grossly intact. Psychiatric: Normal judgment and insight. Alert and  oriented x 3. Normal mood. Musculoskeletal:No digital cyanosis Skin:No Rash, warm and dry Wounds:N/A  I have personally reviewed following labs and imaging studies  LABORATORY DATA: CBC:  Recent Labs Lab 06/01/16 0134 06/01/16 0412  WBC 7.5  --   HGB 10.2* 9.5*  HCT 29.6* 28.0*  MCV 90.5  --   PLT 292  --     Basic Metabolic Panel:  Recent Labs Lab 06/01/16 0134 06/01/16 0412  NA 124* 126*  K 3.5 3.7  CL 90* 92*  CO2 24  --   GLUCOSE 84 81  BUN 15 15  CREATININE 0.86 0.80    CALCIUM 9.2  --     GFR: Estimated Creatinine Clearance: 73.9 mL/min (by C-G formula based on SCr of 0.8 mg/dL).  Liver Function Tests: No results for input(s): AST, ALT, ALKPHOS, BILITOT, PROT, ALBUMIN in the last 168 hours. No results for input(s): LIPASE, AMYLASE in the last 168 hours. No results for input(s): AMMONIA in the last 168 hours.  Coagulation Profile:  Recent Labs Lab 06/01/16 0134  INR 1.03    Cardiac Enzymes:  Recent Labs Lab 06/01/16 0738  TROPONINI <0.03    BNP (last 3 results) No results for input(s): PROBNP in the last 8760 hours.  HbA1C: No results for input(s): HGBA1C in the last 72 hours.  CBG: No results for input(s): GLUCAP in the last 168 hours.  Lipid Profile: No results for input(s): CHOL, HDL, LDLCALC, TRIG, CHOLHDL, LDLDIRECT in the last 72 hours.  Thyroid Function Tests: No results for input(s): TSH, T4TOTAL, FREET4, T3FREE, THYROIDAB in the last 72 hours.  Anemia Panel: No results for input(s): VITAMINB12, FOLATE, FERRITIN, TIBC, IRON, RETICCTPCT in the last 72 hours.  Urine analysis:    Component Value Date/Time   COLORURINE YELLOW 03/21/2013 2003   APPEARANCEUR CLEAR 03/21/2013 2003   LABSPEC 1.031 (H) 03/21/2013 2003   PHURINE 6.0 03/21/2013 2003   GLUCOSEU NEGATIVE 03/21/2013 2003   HGBUR MODERATE (A) 03/21/2013 2003   BILIRUBINUR NEGATIVE 03/21/2013 2003   KETONESUR NEGATIVE 03/21/2013 2003   PROTEINUR NEGATIVE 03/21/2013 2003   UROBILINOGEN 0.2 03/21/2013 2003   NITRITE NEGATIVE 03/21/2013 2003   LEUKOCYTESUR NEGATIVE 03/21/2013 2003    Sepsis Labs: Lactic Acid, Venous No results found for: LATICACIDVEN  MICROBIOLOGY: Recent Results (from the past 240 hour(s))  MRSA PCR Screening     Status: None   Collection Time: 06/01/16  9:27 AM  Result Value Ref Range Status   MRSA by PCR NEGATIVE NEGATIVE Final    Comment:        The GeneXpert MRSA Assay (FDA approved for NASAL specimens only), is one component of  a comprehensive MRSA colonization surveillance program. It is not intended to diagnose MRSA infection nor to guide or monitor treatment for MRSA infections.     RADIOLOGY STUDIES/RESULTS: Dg Chest Portable 1 View  Result Date: 06/01/2016 CLINICAL DATA:  Nonproductive cough for 3 days. Left-sided chest pain tonight, radiating into the left arm. Dyspnea. EXAM: PORTABLE CHEST 1 VIEW COMPARISON:  09/05/2011 FINDINGS: Right-sided central line extends into the low SVC. The lungs are clear. Pulmonary vasculature is normal. No large effusions. Normal heart size. IMPRESSION: No acute cardiopulmonary findings. Electronically Signed   By: Ellery Plunk M.D.   On: 06/01/2016 03:35   Ct Angio Chest/abd/pel For Dissection W And/or Wo Contrast  Result Date: 06/01/2016 CLINICAL DATA:  Constant chest pain, onset at 22:30. Radiating into the left arm and penetrating through to the back. Onset at rest.  Dyspnea. EXAM: CT ANGIOGRAPHY CHEST, ABDOMEN AND PELVIS TECHNIQUE: Multidetector CT imaging through the chest, abdomen and pelvis was performed using the standard protocol during bolus administration of intravenous contrast. Multiplanar reconstructed images and MIPs were obtained and reviewed to evaluate the vascular anatomy. CONTRAST:  100 mL Isovue 370 intravenous COMPARISON:  Radiographs 06/01/2016 FINDINGS: CTA CHEST FINDINGS Cardiovascular: Preferential opacification of the thoracic aorta. No evidence of thoracic aortic aneurysm or dissection. Normal heart size. No pericardial effusion. Good opacification of the pulmonary arteries. No embolism. Moderate atherosclerotic calcification of the coronary arteries and great vessel origins. Mediastinum/Nodes: No enlarged mediastinal, hilar, or axillary lymph nodes. Thyroid gland, trachea, and esophagus demonstrate no significant findings. Lungs/Pleura: Lungs are clear. No pleural effusion or pneumothorax. Musculoskeletal: No chest wall abnormality. No acute or  significant osseous findings. Review of the MIP images confirms the above findings. CTA ABDOMEN AND PELVIS FINDINGS VASCULAR Aorta: Normal caliber aorta without aneurysm, dissection, vasculitis or significant stenosis. Celiac: Patent without evidence of aneurysm, dissection, vasculitis or significant stenosis. SMA: Patent without evidence of aneurysm, dissection, vasculitis or significant stenosis. Renals: Left renal artery is patent. Probable stenosis of the right renal artery in its proximal 1st cm. IMA: Patent without evidence of aneurysm, dissection, vasculitis or significant stenosis. Inflow: Distal aorta and common iliac arteries are heavily calcified but patent. External iliac and common femoral arteries are patent. Femoral bifurcations are patent. Veins: No obvious venous abnormality within the limitations of this arterial phase study. Review of the MIP images confirms the above findings. NON-VASCULAR Hepatobiliary: No focal liver abnormality is seen. No gallstones, gallbladder wall thickening, or biliary dilatation. Pancreas: Unremarkable. No pancreatic ductal dilatation or surrounding inflammatory changes. Spleen: Normal in size without focal abnormality. Adrenals/Urinary Tract: Adrenal glands are unremarkable. Kidneys are normal, without renal calculi, focal lesion, or hydronephrosis. Bladder is unremarkable. Stomach/Bowel: Stomach is within normal limits. Appendix is normal. Moderate colonic diverticulosis. No evidence of bowel wall thickening, distention, or inflammatory changes. Lymphatic: No significant vascular findings are present. No enlarged abdominal or pelvic lymph nodes. Reproductive: Status post hysterectomy. No adnexal masses. Other: No focal inflammation.  No ascites. Musculoskeletal: No significant skeletal lead Review of the MIP images confirms the above findings. IMPRESSION: 1. No acute vascular abnormality. 2. Moderate atherosclerotic calcification of the aorta and coronary arteries.  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 3. Colonic diverticulosis. Electronically Signed   By: Ellery Plunk M.D.   On: 06/01/2016 05:10     LOS: 0 days   Jeoffrey Massed, MD  [facility] Pager:[phone]  If 7PM-7AM, please contact night-coverage [email or web address] Password [facility] 06/01/2016, 12:19 PM

## 2016-06-01 NOTE — ED Triage Notes (Signed)
Pt states that she started having a suffocating chest pain for 1 hour that radiates into left arm and into back.  Pt took 3 SL nitro at home with no relief.

## 2016-06-01 NOTE — Progress Notes (Signed)
CRITICAL VALUE STICKER  CRITICAL VALUE: Trop 0.03  RECEIVER (on-site recipient of call):  DATE & TIME NOTIFIED: 06/01/16 @ 1250  MESSENGER (representative from lab):  MD NOTIFIED: Ghimire via amion  TIME OF NOTIFICATION: 1300  RESPONSE: pending

## 2016-06-01 NOTE — ED Notes (Signed)
Report given to [facility]. 

## 2016-06-01 NOTE — H&P (Addendum)
TRH H&P    Patient Demographics:    [name], is a 66 y.o. female  MRN: [id number]  DOB - [birth year]  Admit Date - 06/01/2016  Referring MD/NP/PA:   Outpatient Primary MD for the patient is Toma Deiters, MD  Patient coming from: home   Chief Complaint  Patient presents with  . Chest Pain      HPI:    [name]  is a 66 y.o. female, History of CAD, status post coronary stents 8 on Plavix, CHF, COPD was brought to the hospital with constant smoking chest pain that started around 10:30 PM. Patient says the pain was radiating to left arm and back. Patient took nitroglycerin and pain did not get better she also took 2 81 mg aspirin prior to admission. Patient says that when pain did not relieve so she came to the ED for further evaluation. She denies diaphoresis. Also complete is a mild shortness of breath and skipped beat with pounding of heart.  Patient has a strong family history of heart disease with both parents had CAD.  In ED, CT angio gram of the chest and abdomen pelvis was done which is negative for dissection. Dr Manus Gunning spoke to Dr. Mayford Knife at [facility] would recommended to start heparin and Nitropaste and transfer patient to [facility] for further evaluation.    Review of systems:    In addition to the HPI above,  No Fever-chills, No Headache, No changes with Vision or hearing, No problems swallowing food or Liquids, No Abdominal pain, No Nausea or Vomiting, bowel movements are regular, No Blood in stool or Urine, No dysuria, No new skin rashes or bruises, No new joints pains-aches,  No new weakness, tingling, numbness in any extremity, No recent weight gain or loss, No polyuria, polydypsia or polyphagia, No significant Mental Stressors.  A full 10 point Review of Systems was done, except as stated above, all other Review of Systems were negative.   With Past History of the  following :    Past Medical History:  Diagnosis Date  . Abnormal chest x-ray with multiple nodules    a. 08/2011 CXR nodular density Right mid to lower chest - f/u CXR 6 wks -> CT if abnl  . Acute lower GI bleeding 12/2012   "from diverticula"  . Anemia, unspecified   . Arthritis    "knees"  . CAD (coronary artery disease)    a. 2009 PCI/DES LAD, LCX, RCA, cba OM1;  b. 08/2011 Cath: LM nl, LAD, LCX, RCA - patent stents, OM1 patent, EF 55%,-> Med Rx;  c. 02/2013 Cath: LM 30, LAD 30-40ost, patent mid stent, LCX 25p, patent mid stent, RCA 25diff, patent prox/mid stent, EF 65%.  . Cardiac dysrhythmia, unspecified   . CHF (congestive heart failure) (HCC)   . Chronic back pain 2002   "broke my back"   . Chronic diastolic CHF (congestive heart failure) (HCC)    a. 08/2011 Echo: EF 55-65%;  b. 02/2013 Echo: EF 60-65%, some SAM of AoV w subvalvular gradient that seems  more significant, rec tee or f/u echo w/ BB, mild MR, mildly dil LA  . COPD (chronic obstructive pulmonary disease) (HCC)   . DDD (degenerative disc disease)   . Gastric ulcer   . GERD (gastroesophageal reflux disease)   . ZOXWRUEA(540.9)    "monthly" (03/22/2013)  . High cholesterol   . History of blood transfusion 12/2012   "2 units for lower GIB"  . HTN (hypertension)   . Hypothyroidism   . Morbid obesity (HCC)   . Osteoarthrosis, unspecified whether generalized or localized, unspecified site   . Serum hepatitis 1970      Past Surgical History:  Procedure Laterality Date  . CARDIAC CATHETERIZATION  08/19/2012  . CESAREAN SECTION  1971  . CORONARY ANGIOPLASTY WITH STENT PLACEMENT     mid lad (promus drug eluting stent) 2009. cutting balloon angioplasty first obtuse marginal. three overlapping stents in the RCA- promus DES x3  . DILATION AND CURETTAGE OF UTERUS  1970   "6 month fetus; abruptio placenta"  . JOINT REPLACEMENT    . LEFT HEART CATHETERIZATION WITH CORONARY ANGIOGRAM N/A 09/04/2011   Procedure: LEFT HEART  CATHETERIZATION WITH CORONARY ANGIOGRAM;  Surgeon: Kathleene Hazel, MD;  Location: [facility] CATH LAB;  Service: Cardiovascular;  Laterality: N/A;  . LEFT HEART CATHETERIZATION WITH CORONARY ANGIOGRAM N/A 03/21/2013   Procedure: LEFT HEART CATHETERIZATION WITH CORONARY ANGIOGRAM;  Surgeon: Rollene Rotunda, MD;  Location: [facility] CATH LAB;  Service: Cardiovascular;  Laterality: N/A;  . SHOULDER OPEN ROTATOR CUFF REPAIR Left 2010  . TOTAL KNEE ARTHROPLASTY Left 2011  . TUBAL LIGATION  1985  . VAGINAL HYSTERECTOMY  1988   "cut out 3/4th of my bladder accidentally" (03/22/2013)      Social History:      Social History  Substance Use Topics  . Smoking status: Former Smoker    Packs/day: 0.50    Years: 40.00    Types: Cigarettes    Start date: 03/24/1966    Quit date: 04/24/2008  . Smokeless tobacco: Never Used  . Alcohol use No       Family History :     Family History  Problem Relation Age of Onset  . Heart attack Other     female <55  . Cancer Other   . Diabetes Other   . Arthritis Other       Home Medications:   Prior to Admission medications   Medication Sig Start Date End Date Taking? Authorizing Provider  albuterol (PROVENTIL) (2.5 MG/3ML) 0.083% nebulizer solution Take 2.5 mg by nebulization every 6 (six) hours as needed for wheezing or shortness of breath.    Historical Provider, MD  aspirin EC 81 MG tablet Take 81 mg by mouth daily.    Historical Provider, MD  atorvastatin (LIPITOR) 80 MG tablet Take 1 tablet (80 mg total) by mouth daily. 02/23/15   Laqueta Linden, MD  benazepril (LOTENSIN) 40 MG tablet Take 1 tablet by mouth daily. 04/21/13   Historical Provider, MD  buPROPion (WELLBUTRIN XL) 150 MG 24 hr tablet Take 150 mg by mouth daily.  01/28/13   Historical Provider, MD  clopidogrel (PLAVIX) 75 MG tablet Take 1 tablet (75 mg total) by mouth daily. 02/28/15   Laqueta Linden, MD  diltiazem (CARDIZEM CD) 120 MG 24 hr capsule Take 1 capsule by mouth daily. 06/05/15    Historical Provider, MD  diphenhydrAMINE (BENADRYL) 25 MG tablet Take 25 mg by mouth 2 (two) times daily.    Historical Provider, MD  docusate  sodium (COLACE) 100 MG capsule Take 100 mg by mouth daily as needed for mild constipation.    Historical Provider, MD  DULoxetine (CYMBALTA) 30 MG capsule Take 30 mg by mouth 2 (two) times daily.    Historical Provider, MD  gabapentin (NEURONTIN) 300 MG capsule Take 1 capsule by mouth 3 (three) times daily. 07/12/14   Historical Provider, MD  gentamicin (GARAMYCIN) 0.3 % ophthalmic solution Place 1 drop into both eyes every 8 (eight) hours. 08/15/15   Historical Provider, MD  isosorbide mononitrate (IMDUR) 60 MG 24 hr tablet Take 1 tablet by mouth  daily 11/05/15   Laqueta LindenSuresh A Koneswaran, MD  levothyroxine (SYNTHROID, LEVOTHROID) 50 MCG tablet Take 1 tablet (50 mcg total) by mouth daily. 08/26/13   Laqueta LindenSuresh A Koneswaran, MD  meloxicam (MOBIC) 15 MG tablet Take 15 mg by mouth daily.    Historical Provider, MD  methocarbamol (ROBAXIN) 750 MG tablet Take 750 mg by mouth 4 (four) times daily.    Historical Provider, MD  nitroGLYCERIN (NITROSTAT) 0.4 MG SL tablet Place 1 tablet (0.4 mg total) under the tongue every 5 (five) minutes as needed for chest pain. 08/17/15   Laqueta LindenSuresh A Koneswaran, MD  potassium chloride (K-DUR,KLOR-CON) 10 MEQ tablet Take 1 tablet by mouth  daily 11/05/15   Laqueta LindenSuresh A Koneswaran, MD  promethazine (PHENERGAN) 25 MG tablet Take 25 mg by mouth every 6 (six) hours as needed for nausea or vomiting.    Historical Provider, MD  ranitidine (ZANTAC) 300 MG tablet Take 1 tablet by mouth 2 (two) times daily. 07/20/15   Historical Provider, MD  ranolazine (RANEXA) 500 MG 12 hr tablet Take 1 tablet (500 mg total) by mouth 2 (two) times daily. 08/23/15   Laqueta LindenSuresh A Koneswaran, MD  traZODone (DESYREL) 100 MG tablet Take 1 tablet by mouth daily. 07/02/15   Historical Provider, MD     Allergies:    No Known Allergies   Physical Exam:   Vitals  Blood pressure 116/81,  pulse 84, temperature 98.6 F (37 C), temperature source Oral, resp. rate 20, height 5' (1.524 m), weight 100.2 kg (221 lb), SpO2 99 %.  1.  General: Appears in no acute distress  2. Psychiatric:  Intact judgement and  insight, awake alert, oriented x 3.  3. Neurologic: No focal neurological deficits, all cranial nerves intact.Strength 5/5 all 4 extremities, sensation intact all 4 extremities, plantars down going.  4. Eyes :  anicteric sclerae, moist conjunctivae with no lid lag. PERRLA.  5. ENMT:  Oropharynx clear with moist mucous membranes and good dentition  6. Neck:  supple, no cervical lymphadenopathy appriciated, No thyromegaly  7. Respiratory : Normal respiratory effort, good air movement bilaterally,clear to  auscultation bilaterally  8. Cardiovascular : RRR, no gallops, rubs or murmurs, no leg edema  9. Gastrointestinal:  Positive bowel sounds, abdomen soft, non-tender to palpation,no hepatosplenomegaly, no rigidity or guarding       10. Skin:  No cyanosis, normal texture and turgor, no rash, lesions or ulcers  11.Musculoskeletal:  Good muscle tone,  joints appear normal , no effusions,  normal range of motion    Data Review:    CBC  Recent Labs Lab 06/01/16 0134 06/01/16 0412  WBC 7.5  --   HGB 10.2* 9.5*  HCT 29.6* 28.0*  PLT 292  --   MCV 90.5  --   MCH 31.2  --   MCHC 34.5  --   RDW 14.7  --    ------------------------------------------------------------------------------------------------------------------  Chemistries  Recent Labs Lab 06/01/16 0134 06/01/16 0412  NA 124* 126*  K 3.5 3.7  CL 90* 92*  CO2 24  --   GLUCOSE 84 81  BUN 15 15  CREATININE 0.86 0.80  CALCIUM 9.2  --    ------------------------------------------------------------------------------------------------------------------  ------------------------------------------------------------------------------------------------------------------ GFR: Estimated  Creatinine Clearance: 75.6 mL/min (by C-G formula based on SCr of 0.8 mg/dL). Liver Function Tests: No results for input(s): AST, ALT, ALKPHOS, BILITOT, PROT, ALBUMIN in the last 168 hours. No results for input(s): LIPASE, AMYLASE in the last 168 hours. No results for input(s): AMMONIA in the last 168 hours. Coagulation Profile:  Recent Labs Lab 06/01/16 0134  INR 1.03    --------------------------------------------------------------------------------------------------------------- Urine analysis:    Component Value Date/Time   COLORURINE YELLOW 03/21/2013 2003   APPEARANCEUR CLEAR 03/21/2013 2003   LABSPEC 1.031 (H) 03/21/2013 2003   PHURINE 6.0 03/21/2013 2003   GLUCOSEU NEGATIVE 03/21/2013 2003   HGBUR MODERATE (A) 03/21/2013 2003   BILIRUBINUR NEGATIVE 03/21/2013 2003   KETONESUR NEGATIVE 03/21/2013 2003   PROTEINUR NEGATIVE 03/21/2013 2003   UROBILINOGEN 0.2 03/21/2013 2003   NITRITE NEGATIVE 03/21/2013 2003   LEUKOCYTESUR NEGATIVE 03/21/2013 2003      Imaging Results:    Dg Chest Portable 1 View  Result Date: 06/01/2016 CLINICAL DATA:  Nonproductive cough for 3 days. Left-sided chest pain tonight, radiating into the left arm. Dyspnea. EXAM: PORTABLE CHEST 1 VIEW COMPARISON:  09/05/2011 FINDINGS: Right-sided central line extends into the low SVC. The lungs are clear. Pulmonary vasculature is normal. No large effusions. Normal heart size. IMPRESSION: No acute cardiopulmonary findings. Electronically Signed   By: Ellery Plunk M.D.   On: 06/01/2016 03:35   Ct Angio Chest/abd/pel For Dissection W And/or Wo Contrast  Result Date: 06/01/2016 CLINICAL DATA:  Constant chest pain, onset at 22:30. Radiating into the left arm and penetrating through to the back. Onset at rest. Dyspnea. EXAM: CT ANGIOGRAPHY CHEST, ABDOMEN AND PELVIS TECHNIQUE: Multidetector CT imaging through the chest, abdomen and pelvis was performed using the standard protocol during bolus administration of  intravenous contrast. Multiplanar reconstructed images and MIPs were obtained and reviewed to evaluate the vascular anatomy. CONTRAST:  100 mL Isovue 370 intravenous COMPARISON:  Radiographs 06/01/2016 FINDINGS: CTA CHEST FINDINGS Cardiovascular: Preferential opacification of the thoracic aorta. No evidence of thoracic aortic aneurysm or dissection. Normal heart size. No pericardial effusion. Good opacification of the pulmonary arteries. No embolism. Moderate atherosclerotic calcification of the coronary arteries and great vessel origins. Mediastinum/Nodes: No enlarged mediastinal, hilar, or axillary lymph nodes. Thyroid gland, trachea, and esophagus demonstrate no significant findings. Lungs/Pleura: Lungs are clear. No pleural effusion or pneumothorax. Musculoskeletal: No chest wall abnormality. No acute or significant osseous findings. Review of the MIP images confirms the above findings. CTA ABDOMEN AND PELVIS FINDINGS VASCULAR Aorta: Normal caliber aorta without aneurysm, dissection, vasculitis or significant stenosis. Celiac: Patent without evidence of aneurysm, dissection, vasculitis or significant stenosis. SMA: Patent without evidence of aneurysm, dissection, vasculitis or significant stenosis. Renals: Left renal artery is patent. Probable stenosis of the right renal artery in its proximal 1st cm. IMA: Patent without evidence of aneurysm, dissection, vasculitis or significant stenosis. Inflow: Distal aorta and common iliac arteries are heavily calcified but patent. External iliac and common femoral arteries are patent. Femoral bifurcations are patent. Veins: No obvious venous abnormality within the limitations of this arterial phase study. Review of the MIP images confirms the above findings. NON-VASCULAR Hepatobiliary: No focal liver abnormality is seen. No gallstones, gallbladder wall thickening, or  biliary dilatation. Pancreas: Unremarkable. No pancreatic ductal dilatation or surrounding inflammatory  changes. Spleen: Normal in size without focal abnormality. Adrenals/Urinary Tract: Adrenal glands are unremarkable. Kidneys are normal, without renal calculi, focal lesion, or hydronephrosis. Bladder is unremarkable. Stomach/Bowel: Stomach is within normal limits. Appendix is normal. Moderate colonic diverticulosis. No evidence of bowel wall thickening, distention, or inflammatory changes. Lymphatic: No significant vascular findings are present. No enlarged abdominal or pelvic lymph nodes. Reproductive: Status post hysterectomy. No adnexal masses. Other: No focal inflammation.  No ascites. Musculoskeletal: No significant skeletal lead Review of the MIP images confirms the above findings. IMPRESSION: 1. No acute vascular abnormality. 2. Moderate atherosclerotic calcification of the aorta and coronary arteries. 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 3. Colonic diverticulosis. Electronically Signed   By: Ellery Plunk M.D.   On: 06/01/2016 05:10    My personal review of EKG: Sinus arrhythmia   Assessment & Plan:    Active Problems:   Hypothyroidism   Essential hypertension   Chest pain   Hyponatremia   Unstable angina (HCC)   1. Unstable angina/chest pain- patient has significant history of CAD with stents. Started on heparin per pharmacy, Nitropaste 15 mg every 6 hours, patient will be transferred to stepdown at [facility]. Cardiology is aware of patient transfer. We'll start morphine 2 mg IV every 4 hours when necessary for pain 2. Hyponatremia- sodium 126, will check TSH, serum osmolality. Will follow BMP in a.m. 3. CAD - continue Plavix, Lipitor, aspirin 81 mg by mouth daily, Ranexa. 4.  Hypothyroidism- continue Synthroid. 5. Anemia- chronic, last hemoglobin from 2014 was 8.5. Will check anemia panel.  Patient will be transferred to [facility]. Dr. Julian Reil is accepting physician.  DVT Prophylaxis-   heparin  AM Labs Ordered, also please review Full  Orders  Family Communication:   Code Status:Full code   Admission status: Observation   Time spent in minutes : 60 minutes   Rayleigh Gillyard S M.D on 06/01/2016 at 6:29 AM  Between 7am to 7pm - Pager - [phone]. After 7pm go to [email or web address] - password [facility]  [facility] - Office  [phone]

## 2016-06-02 ENCOUNTER — Encounter (HOSPITAL_COMMUNITY)
Admission: EM | Disposition: A | Discharge status: Home / Self Care | Payer: Self-pay | Source: Home / Self Care | Attending: Internal Medicine

## 2016-06-02 ENCOUNTER — Observation Stay (HOSPITAL_BASED_OUTPATIENT_CLINIC_OR_DEPARTMENT_OTHER): Payer: Medicare Other

## 2016-06-02 DIAGNOSIS — R0789 Other chest pain: Secondary | ICD-10-CM | POA: Diagnosis not present

## 2016-06-02 DIAGNOSIS — J449 Chronic obstructive pulmonary disease, unspecified: Secondary | ICD-10-CM | POA: Diagnosis present

## 2016-06-02 DIAGNOSIS — I471 Supraventricular tachycardia: Secondary | ICD-10-CM

## 2016-06-02 DIAGNOSIS — Z7982 Long term (current) use of aspirin: Secondary | ICD-10-CM | POA: Diagnosis not present

## 2016-06-02 DIAGNOSIS — Z955 Presence of coronary angioplasty implant and graft: Secondary | ICD-10-CM | POA: Diagnosis not present

## 2016-06-02 DIAGNOSIS — I4891 Unspecified atrial fibrillation: Secondary | ICD-10-CM | POA: Diagnosis not present

## 2016-06-02 DIAGNOSIS — Z7902 Long term (current) use of antithrombotics/antiplatelets: Secondary | ICD-10-CM | POA: Diagnosis not present

## 2016-06-02 DIAGNOSIS — E871 Hypo-osmolality and hyponatremia: Secondary | ICD-10-CM

## 2016-06-02 DIAGNOSIS — Z87891 Personal history of nicotine dependence: Secondary | ICD-10-CM | POA: Diagnosis not present

## 2016-06-02 DIAGNOSIS — I359 Nonrheumatic aortic valve disorder, unspecified: Secondary | ICD-10-CM

## 2016-06-02 DIAGNOSIS — I10 Essential (primary) hypertension: Secondary | ICD-10-CM | POA: Diagnosis not present

## 2016-06-02 DIAGNOSIS — Z96652 Presence of left artificial knee joint: Secondary | ICD-10-CM | POA: Diagnosis present

## 2016-06-02 DIAGNOSIS — K219 Gastro-esophageal reflux disease without esophagitis: Secondary | ICD-10-CM | POA: Diagnosis present

## 2016-06-02 DIAGNOSIS — Z8249 Family history of ischemic heart disease and other diseases of the circulatory system: Secondary | ICD-10-CM | POA: Diagnosis not present

## 2016-06-02 DIAGNOSIS — I25110 Atherosclerotic heart disease of native coronary artery with unstable angina pectoris: Secondary | ICD-10-CM | POA: Diagnosis not present

## 2016-06-02 DIAGNOSIS — E039 Hypothyroidism, unspecified: Secondary | ICD-10-CM | POA: Diagnosis not present

## 2016-06-02 DIAGNOSIS — I200 Unstable angina: Secondary | ICD-10-CM | POA: Diagnosis not present

## 2016-06-02 DIAGNOSIS — I352 Nonrheumatic aortic (valve) stenosis with insufficiency: Secondary | ICD-10-CM | POA: Diagnosis present

## 2016-06-02 DIAGNOSIS — I110 Hypertensive heart disease with heart failure: Secondary | ICD-10-CM | POA: Diagnosis not present

## 2016-06-02 DIAGNOSIS — Z6841 Body Mass Index (BMI) 40.0 and over, adult: Secondary | ICD-10-CM | POA: Diagnosis not present

## 2016-06-02 DIAGNOSIS — I252 Old myocardial infarction: Secondary | ICD-10-CM | POA: Diagnosis not present

## 2016-06-02 DIAGNOSIS — E785 Hyperlipidemia, unspecified: Secondary | ICD-10-CM | POA: Diagnosis present

## 2016-06-02 DIAGNOSIS — Z79899 Other long term (current) drug therapy: Secondary | ICD-10-CM | POA: Diagnosis not present

## 2016-06-02 DIAGNOSIS — Z791 Long term (current) use of non-steroidal anti-inflammatories (NSAID): Secondary | ICD-10-CM | POA: Diagnosis not present

## 2016-06-02 DIAGNOSIS — E7800 Pure hypercholesterolemia, unspecified: Secondary | ICD-10-CM | POA: Diagnosis present

## 2016-06-02 DIAGNOSIS — Z90710 Acquired absence of both cervix and uterus: Secondary | ICD-10-CM | POA: Diagnosis not present

## 2016-06-02 DIAGNOSIS — I5032 Chronic diastolic (congestive) heart failure: Secondary | ICD-10-CM | POA: Diagnosis not present

## 2016-06-02 DIAGNOSIS — D649 Anemia, unspecified: Secondary | ICD-10-CM | POA: Diagnosis present

## 2016-06-02 HISTORY — PX: LEFT HEART CATH AND CORONARY ANGIOGRAPHY: CATH118249

## 2016-06-02 LAB — CBC
HCT: 29.4 % — ABNORMAL LOW (ref 36.0–46.0)
HEMOGLOBIN: 9.6 g/dL — AB (ref 12.0–15.0)
MCH: 30 pg (ref 26.0–34.0)
MCHC: 32.7 g/dL (ref 30.0–36.0)
MCV: 91.9 fL (ref 78.0–100.0)
Platelets: 310 10*3/uL (ref 150–400)
RBC: 3.2 MIL/uL — ABNORMAL LOW (ref 3.87–5.11)
RDW: 15.1 % (ref 11.5–15.5)
WBC: 10.5 10*3/uL (ref 4.0–10.5)

## 2016-06-02 LAB — COMPREHENSIVE METABOLIC PANEL
ALBUMIN: 3.8 g/dL (ref 3.5–5.0)
ALK PHOS: 35 U/L — AB (ref 38–126)
ALT: 11 U/L — ABNORMAL LOW (ref 14–54)
ANION GAP: 9 (ref 5–15)
AST: 15 U/L (ref 15–41)
BUN: 20 mg/dL (ref 6–20)
CO2: 24 mmol/L (ref 22–32)
Calcium: 8.6 mg/dL — ABNORMAL LOW (ref 8.9–10.3)
Chloride: 93 mmol/L — ABNORMAL LOW (ref 101–111)
Creatinine, Ser: 0.73 mg/dL (ref 0.44–1.00)
GFR calc Af Amer: >60 mL/min (ref >60)
GFR calc non Af Amer: >60 mL/min (ref >60)
GLUCOSE: 102 mg/dL — AB (ref 65–99)
POTASSIUM: 4.3 mmol/L (ref 3.5–5.1)
SODIUM: 126 mmol/L — AB (ref 135–145)
Total Bilirubin: 0.5 mg/dL (ref 0.3–1.2)
Total Protein: 6.2 g/dL — ABNORMAL LOW (ref 6.5–8.1)

## 2016-06-02 LAB — HEPARIN LEVEL (UNFRACTIONATED)
Heparin Unfractionated: 0.43 IU/mL (ref 0.30–0.70)
Heparin Unfractionated: 0.55 IU/mL (ref 0.30–0.70)

## 2016-06-02 LAB — HIV ANTIBODY (ROUTINE TESTING W REFLEX): HIV SCREEN 4TH GENERATION: NONREACTIVE

## 2016-06-02 LAB — ECHOCARDIOGRAM COMPLETE
Height: 60 in
WEIGHTICAEL: 3408 [oz_av]

## 2016-06-02 LAB — SODIUM, URINE, RANDOM: Sodium, Ur: 22 mmol/L

## 2016-06-02 LAB — OSMOLALITY, URINE: Osmolality, Ur: 271 mOsm/kg — ABNORMAL LOW (ref 300–900)

## 2016-06-02 LAB — POCT ACTIVATED CLOTTING TIME: Activated Clotting Time: 142 seconds

## 2016-06-02 SURGERY — LEFT HEART CATH AND CORONARY ANGIOGRAPHY

## 2016-06-02 MED ORDER — LIDOCAINE HCL (PF) 1 % IJ SOLN
INTRAMUSCULAR | Status: AC
  Filled 2016-06-02: qty 30

## 2016-06-02 MED ORDER — SODIUM CHLORIDE 0.9% FLUSH: 3.0000 mL | Freq: Two times a day (BID) | INTRAVENOUS | Status: DC

## 2016-06-02 MED ORDER — SODIUM CHLORIDE 0.9% FLUSH: 3.0000 mL | INTRAVENOUS | Status: DC | PRN

## 2016-06-02 MED ORDER — MIDAZOLAM HCL 2 MG/2ML IJ SOLN
INTRAMUSCULAR | Status: AC
  Filled 2016-06-02: qty 2

## 2016-06-02 MED ORDER — ACETAMINOPHEN 325 MG PO TABS: 650.0000 mg | ORAL_TABLET | ORAL | Status: DC | PRN

## 2016-06-02 MED ORDER — HEPARIN (PORCINE) IN NACL 2-0.9 UNIT/ML-% IJ SOLN
INTRAMUSCULAR | Status: DC | PRN
Start: 2016-06-02 — End: 2016-06-02
  Administered 2016-06-02: 1000 mL via INTRA_ARTERIAL

## 2016-06-02 MED ORDER — FENTANYL CITRATE (PF) 100 MCG/2ML IJ SOLN
INTRAMUSCULAR | Status: AC
  Filled 2016-06-02: qty 2

## 2016-06-02 MED ORDER — ASPIRIN 81 MG PO CHEW: 81.0000 mg | CHEWABLE_TABLET | ORAL | Status: DC

## 2016-06-02 MED ORDER — HEPARIN (PORCINE) IN NACL 2-0.9 UNIT/ML-% IJ SOLN
INTRAMUSCULAR | Status: AC
  Filled 2016-06-02: qty 1000

## 2016-06-02 MED ORDER — SODIUM CHLORIDE 0.9 % WEIGHT BASED INFUSION
1.0000 mL/kg/h | INTRAVENOUS | Status: DC
  Administered 2016-06-02: 1 mL/kg/h via INTRAVENOUS

## 2016-06-02 MED ORDER — MIDAZOLAM HCL 2 MG/2ML IJ SOLN
INTRAMUSCULAR | Status: DC | PRN
  Administered 2016-06-02: 2 mg via INTRAVENOUS

## 2016-06-02 MED ORDER — SODIUM CHLORIDE 0.9 % WEIGHT BASED INFUSION: 3.0000 mL/kg/h | INTRAVENOUS | Status: DC

## 2016-06-02 MED ORDER — SODIUM CHLORIDE 0.9 % IV SOLN: INTRAVENOUS | Status: AC

## 2016-06-02 MED ORDER — ASPIRIN 81 MG PO CHEW
81.0000 mg | CHEWABLE_TABLET | ORAL | Status: AC
  Administered 2016-06-02: 81 mg via ORAL
  Filled 2016-06-02: qty 1

## 2016-06-02 MED ORDER — SODIUM CHLORIDE 0.9 % WEIGHT BASED INFUSION: 1.0000 mL/kg/h | INTRAVENOUS | Status: DC

## 2016-06-02 MED ORDER — FENTANYL CITRATE (PF) 100 MCG/2ML IJ SOLN
INTRAMUSCULAR | Status: DC | PRN
  Administered 2016-06-02: 25 ug via INTRAVENOUS

## 2016-06-02 MED ORDER — SODIUM CHLORIDE 0.9% FLUSH
3.0000 mL | Freq: Two times a day (BID) | INTRAVENOUS | Status: DC
  Administered 2016-06-02: 3 mL via INTRAVENOUS

## 2016-06-02 MED ORDER — SODIUM CHLORIDE 0.9 % IV SOLN: 250.0000 mL | INTRAVENOUS | Status: DC | PRN

## 2016-06-02 MED ORDER — IOPAMIDOL (ISOVUE-370) INJECTION 76%
INTRAVENOUS | Status: DC | PRN
  Administered 2016-06-02: 50 mL via INTRA_ARTERIAL

## 2016-06-02 MED ORDER — ONDANSETRON HCL 4 MG/2ML IJ SOLN: 4.0000 mg | Freq: Four times a day (QID) | INTRAMUSCULAR | Status: DC | PRN

## 2016-06-02 MED ORDER — SODIUM CHLORIDE 0.9 % WEIGHT BASED INFUSION
3.0000 mL/kg/h | INTRAVENOUS | Status: DC
Start: 2016-06-02 — End: 2016-06-02
  Administered 2016-06-02: 3 mL/kg/h via INTRAVENOUS

## 2016-06-02 MED ORDER — LIDOCAINE HCL (PF) 1 % IJ SOLN
INTRAMUSCULAR | Status: DC | PRN
  Administered 2016-06-02: 35 mL via INTRADERMAL

## 2016-06-02 MED ORDER — IOPAMIDOL (ISOVUE-370) INJECTION 76%
INTRAVENOUS | Status: AC
  Filled 2016-06-02: qty 100

## 2016-06-02 SURGICAL SUPPLY — 8 items
CATH INFINITI 5FR MULTPACK ANG (CATHETERS) ×3 IMPLANT
KIT HEART LEFT (KITS) ×3 IMPLANT
PACK CARDIAC CATHETERIZATION (CUSTOM PROCEDURE TRAY) ×3 IMPLANT
SHEATH PINNACLE 5F 10CM (SHEATH) ×3 IMPLANT
SYR MEDRAD MARK V 150ML (SYRINGE) ×3 IMPLANT
TRANSDUCER W/STOPCOCK (MISCELLANEOUS) ×3 IMPLANT
TUBING CIL FLEX 10 FLL-RA (TUBING) ×3 IMPLANT
WIRE EMERALD 3MM-J .035X150CM (WIRE) ×3 IMPLANT

## 2016-06-02 NOTE — H&P (View-Only) (Signed)
Progress Note  Patient Name: [name] Date of Encounter: 06/02/2016  Primary Cardiologist: Purvis Sheffield  Subjective   Still complaining of 3/10 chest pain Had breakfast.   Inpatient Medications    Scheduled Meds: . aspirin EC  81 mg Oral Daily  . atorvastatin  80 mg Oral QPM  . benazepril  40 mg Oral Daily  . buPROPion  150 mg Oral Daily  . clopidogrel  75 mg Oral Daily  . diltiazem  180 mg Oral Daily  . DULoxetine  30 mg Oral BID  . gabapentin  300 mg Oral TID  . levothyroxine  50 mcg Oral QAC breakfast  . pantoprazole  40 mg Oral Q1200  . potassium chloride  10 mEq Oral Daily  . ranolazine  500 mg Oral BID  . traZODone  100 mg Oral QHS   Continuous Infusions: . heparin 1,300 Units/hr (06/02/16 0345)  . nitroGLYCERIN 5 mcg/min (06/01/16 2334)   PRN Meds: acetaminophen, albuterol, morphine injection, ondansetron **OR** ondansetron (ZOFRAN) IV   Vital Signs    Vitals:   06/02/16 0000 06/02/16 0312 06/02/16 0815 06/02/16 0911  BP: (!) 129/58 (!) 115/56 135/62 138/65  Pulse:  99  89  Resp:  18  (!) 21  Temp:  98.6 F (37 C) 98.6 F (37 C)   TempSrc:  Oral Oral   SpO2:  95%  98%  Weight:      Height:        Intake/Output Summary (Last 24 hours) at 06/02/16 0939 Last data filed at 06/02/16 0900  Gross per 24 hour  Intake          1186.82 ml  Output             1700 ml  Net          -513.18 ml   Filed Weights   06/01/16 0013 06/01/16 0923  Weight: 221 lb (100.2 kg) 213 lb (96.6 kg)    Telemetry    NSR frequent PAC;s 06/02/2016  - Personally Reviewed  ECG    NSR  RBBB frequent PAC;s 06/02/2016  Personally Reviewed  Physical Exam: BP 138/65   Pulse 89   Temp 98.6 F (37 C) (Oral)   Resp (!) 21   Ht 5' (1.524 m) Comment: patient states  Wt 213 lb (96.6 kg)   SpO2 98%   BMI 41.60 kg/m    Affect appropriate Healthy:  appears stated age HEENT: normal Neck supple with no adenopathy JVP normal no bruits no thyromegaly Lungs clear with no  wheezing and good diaphragmatic motion Heart:  S1/S2  SEM  murmur, no rub, gallop or click PMI normal Abdomen: benighn, BS positve, no tenderness, no AAA no bruit.  No HSM or HJR Distal pulses intact with no bruits No edema Neuro non-focal Skin warm and dry No muscular weakness Poor ulnar flow on right wrist with Allen's test   Labs    Chemistry Recent Labs Lab 06/01/16 0134 06/01/16 0412 06/02/16 0354  NA 124* 126* 126*  K 3.5 3.7 4.3  CL 90* 92* 93*  CO2 24  --  24  GLUCOSE 84 81 102*  BUN 15 15 20   CREATININE 0.86 0.80 0.73  CALCIUM 9.2  --  8.6*  PROT  --   --  6.2*  ALBUMIN  --   --  3.8  AST  --   --  15  ALT  --   --  11*  ALKPHOS  --   --  35*  BILITOT  --   --  0.5  GFRNONAA >60  --  >60  GFRAA >60  --  >60  ANIONGAP 10  --  9     Hematology Recent Labs Lab 06/01/16 0134 06/01/16 0412 06/01/16 1355 06/02/16 0354  WBC 7.5  --   --  10.5  RBC 3.27*  --  4.25 3.20*  HGB 10.2* 9.5*  --  9.6*  HCT 29.6* 28.0*  --  29.4*  MCV 90.5  --   --  91.9  MCH 31.2  --   --  30.0  MCHC 34.5  --   --  32.7  RDW 14.7  --   --  15.1  PLT 292  --   --  310    Cardiac Enzymes Recent Labs Lab 06/01/16 0738 06/01/16 1041 06/01/16 1355 06/01/16 2220  TROPONINI <0.03 0.03* 0.07* <0.03    Recent Labs Lab 06/01/16 0410  TROPIPOC 0.01     BNPNo results for input(s): BNP, PROBNP in the last 168 hours.   DDimer No results for input(s): DDIMER in the last 168 hours.   Radiology    Dg Chest Portable 1 View  Result Date: 06/01/2016 CLINICAL DATA:  Nonproductive cough for 3 days. Left-sided chest pain tonight, radiating into the left arm. Dyspnea. EXAM: PORTABLE CHEST 1 VIEW COMPARISON:  09/05/2011 FINDINGS: Right-sided central line extends into the low SVC. The lungs are clear. Pulmonary vasculature is normal. No large effusions. Normal heart size. IMPRESSION: No acute cardiopulmonary findings. Electronically Signed   By: Ellery Plunkaniel R Mitchell M.D.   On: 06/01/2016  03:35   Ct Angio Chest/abd/pel For Dissection W And/or Wo Contrast  Result Date: 06/01/2016 CLINICAL DATA:  Constant chest pain, onset at 22:30. Radiating into the left arm and penetrating through to the back. Onset at rest. Dyspnea. EXAM: CT ANGIOGRAPHY CHEST, ABDOMEN AND PELVIS TECHNIQUE: Multidetector CT imaging through the chest, abdomen and pelvis was performed using the standard protocol during bolus administration of intravenous contrast. Multiplanar reconstructed images and MIPs were obtained and reviewed to evaluate the vascular anatomy. CONTRAST:  100 mL Isovue 370 intravenous COMPARISON:  Radiographs 06/01/2016 FINDINGS: CTA CHEST FINDINGS Cardiovascular: Preferential opacification of the thoracic aorta. No evidence of thoracic aortic aneurysm or dissection. Normal heart size. No pericardial effusion. Good opacification of the pulmonary arteries. No embolism. Moderate atherosclerotic calcification of the coronary arteries and great vessel origins. Mediastinum/Nodes: No enlarged mediastinal, hilar, or axillary lymph nodes. Thyroid gland, trachea, and esophagus demonstrate no significant findings. Lungs/Pleura: Lungs are clear. No pleural effusion or pneumothorax. Musculoskeletal: No chest wall abnormality. No acute or significant osseous findings. Review of the MIP images confirms the above findings. CTA ABDOMEN AND PELVIS FINDINGS VASCULAR Aorta: Normal caliber aorta without aneurysm, dissection, vasculitis or significant stenosis. Celiac: Patent without evidence of aneurysm, dissection, vasculitis or significant stenosis. SMA: Patent without evidence of aneurysm, dissection, vasculitis or significant stenosis. Renals: Left renal artery is patent. Probable stenosis of the right renal artery in its proximal 1st cm. IMA: Patent without evidence of aneurysm, dissection, vasculitis or significant stenosis. Inflow: Distal aorta and common iliac arteries are heavily calcified but patent. External iliac and  common femoral arteries are patent. Femoral bifurcations are patent. Veins: No obvious venous abnormality within the limitations of this arterial phase study. Review of the MIP images confirms the above findings. NON-VASCULAR Hepatobiliary: No focal liver abnormality is seen. No gallstones, gallbladder wall thickening, or biliary dilatation. Pancreas: Unremarkable. No pancreatic ductal dilatation or surrounding inflammatory  changes. Spleen: Normal in size without focal abnormality. Adrenals/Urinary Tract: Adrenal glands are unremarkable. Kidneys are normal, without renal calculi, focal lesion, or hydronephrosis. Bladder is unremarkable. Stomach/Bowel: Stomach is within normal limits. Appendix is normal. Moderate colonic diverticulosis. No evidence of bowel wall thickening, distention, or inflammatory changes. Lymphatic: No significant vascular findings are present. No enlarged abdominal or pelvic lymph nodes. Reproductive: Status post hysterectomy. No adnexal masses. Other: No focal inflammation.  No ascites. Musculoskeletal: No significant skeletal lead Review of the MIP images confirms the above findings. IMPRESSION: 1. No acute vascular abnormality. 2. Moderate atherosclerotic calcification of the aorta and coronary arteries. 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 3. Colonic diverticulosis. Electronically Signed   By: Ellery Plunk M.D.   On: 06/01/2016 05:10    Cardiac Studies   08/24/15 Myovue personally reviewed normal no ischemia 03/21/13 Echo EF 60-65% ? Subvalvular gradient   Patient Profile     66 y.o. female known CAD with DES in all 3 major epicardial vessels patent by  Cath in 2014  Non ischemic myovue June 2017 Ongoing SSCP on nitro and heparin CT negative dissection.   Assessment & Plan    Atrial Tachycardia:  cardizem increased may benefit from amiodarone and outpatient holter  Murmur: echo pending history of subvalvular gradient   Chest Pain : known CAD discussed  options with her at length favor diagnostic cath today Previously done from [facility] ? Because Allens' test positive on right. Continue heparin and nitro Lab called orders written  Spent over 35 minutes counseling and direct care with patient and spouse regarding decision to proceed With cath and risks involved   Signed, Charlton Haws, MD  06/02/2016, 9:39 AM

## 2016-06-02 NOTE — Progress Notes (Signed)
PROGRESS NOTE        PATIENT DETAILS Name: [name] Age: 66 y.o. Sex: female Date of Birth: 10/12/1950 Admit Date: 06/01/2016 Admitting Physician Meredeth Ide, MD WUJ:[id number] Tillie Rung, MD  Brief Narrative: Patient is a 66 y.o. female with hx of CAD s/p last PCI in 2014 admitted for evaluation of chest pain and palpitations. See below for further details.   Subjective: Minimal chest pain-down to 3/10. But lying comfortably in bed  Assessment/Plan: Unstable Angina: Only minimal chest pain today, cardiac enzymes are negative. Cardiology planning on LHC today. Continue ASA,Plavix, Heparin gtt, and Nitroglycerin infusion. CTA Chest/Abd negative for PE and dissection  Atrial Tachycardia:in NSR-continue Cardizem-await further recommendations from  Hyponatremia: euvolemic on exam-suspect SIADH-seems to have chronic hyponatremia at baseline-avoid excessive fluids-she is euvolemic on exam-once she is back from cardiac catheterization, we can consider starting diuretics. She is assymptomatic from hyponatremia.  HTN: controlled-continue IV nitroglycerin gtt, cardizem and Benazepril  Dyslipidemia: continue Statin  Hypothyroidism- continue Synthroid-TSH only minimally elevated, hold off on adjusting Synthroid dosing at this time-not known when it was last adjusted deferred to PCP.  Anemia: normocytic-follow anemia panel.  GERD:PPI  COPD: lungs clear-continue prn bronchodilators  Morbid Obesity  DVT Prophylaxis: Full dose anticoagulation with Heparin  Code Status: Full code   Family Communication: Family member at bedside  Disposition Plan: Remain inpatient-remain in SDU  Antimicrobial agents: Anti-infectives    None      Procedures: None  CONSULTS:  cardiology  Time spent: 25- minutes-Greater than 50% of this time was spent in counseling, explanation of diagnosis, planning of further management, and coordination of  care.  MEDICATIONS: Scheduled Meds: . aspirin EC  81 mg Oral Daily  . atorvastatin  80 mg Oral QPM  . benazepril  40 mg Oral Daily  . buPROPion  150 mg Oral Daily  . clopidogrel  75 mg Oral Daily  . diltiazem  180 mg Oral Daily  . DULoxetine  30 mg Oral BID  . gabapentin  300 mg Oral TID  . levothyroxine  50 mcg Oral QAC breakfast  . pantoprazole  40 mg Oral Q1200  . potassium chloride  10 mEq Oral Daily  . ranolazine  500 mg Oral BID  . traZODone  100 mg Oral QHS   Continuous Infusions: . heparin 1,300 Units/hr (06/02/16 0345)  . nitroGLYCERIN 5 mcg/min (06/01/16 2334)   PRN Meds:.acetaminophen, albuterol, morphine injection, ondansetron **OR** ondansetron (ZOFRAN) IV   PHYSICAL EXAM: Vital signs: Vitals:   06/02/16 0000 06/02/16 0312 06/02/16 0815 06/02/16 0911  BP: (!) 129/58 (!) 115/56 135/62 138/65  Pulse:  99  89  Resp:  18  (!) 21  Temp:  98.6 F (37 C) 98.6 F (37 C)   TempSrc:  Oral Oral   SpO2:  95%  98%  Weight:      Height:       Filed Weights   06/01/16 0013 06/01/16 0923  Weight: 100.2 kg (221 lb) 96.6 kg (213 lb)   Body mass index is 41.6 kg/m.   General appearance :Awake, alert, not in any distress. Speech Clear. Eyes:, pupils equally reactive to light and accomodation,no scleral icterus. HEENT: Atraumatic and Normocephalic Neck: supple, no JVD. No cervical lymphadenopathy.  Resp:Good air entry bilaterally, no added sounds  CVS: S1 S2 regular,+syst murmur GI: Bowel sounds present, Non tender and  not distended with no gaurding, rigidity or rebound.No organomegaly Extremities: B/L Lower Ext shows no edema, both legs are warm to touch Neurology:  speech clear,Non focal, sensation is grossly intact. Psychiatric: Normal judgment and insight. Alert and oriented x 3. Normal mood. Musculoskeletal:No digital cyanosis Skin:No Rash, warm and dry Wounds:N/A  I have personally reviewed following labs and imaging studies  LABORATORY  DATA: CBC:  Recent Labs Lab 06/01/16 0134 06/01/16 0412 06/02/16 0354  WBC 7.5  --  10.5  HGB 10.2* 9.5* 9.6*  HCT 29.6* 28.0* 29.4*  MCV 90.5  --  91.9  PLT 292  --  310    Basic Metabolic Panel:  Recent Labs Lab 06/01/16 0134 06/01/16 0412 06/02/16 0354  NA 124* 126* 126*  K 3.5 3.7 4.3  CL 90* 92* 93*  CO2 24  --  24  GLUCOSE 84 81 102*  BUN 15 15 20   CREATININE 0.86 0.80 0.73  CALCIUM 9.2  --  8.6*    GFR: Estimated Creatinine Clearance: 73.9 mL/min (by C-G formula based on SCr of 0.73 mg/dL).  Liver Function Tests:  Recent Labs Lab 06/02/16 0354  AST 15  ALT 11*  ALKPHOS 35*  BILITOT 0.5  PROT 6.2*  ALBUMIN 3.8   No results for input(s): LIPASE, AMYLASE in the last 168 hours. No results for input(s): AMMONIA in the last 168 hours.  Coagulation Profile:  Recent Labs Lab 06/01/16 0134  INR 1.03    Cardiac Enzymes:  Recent Labs Lab 06/01/16 0738 06/01/16 1041 06/01/16 1355 06/01/16 2220  TROPONINI <0.03 0.03* 0.07* <0.03    BNP (last 3 results) No results for input(s): PROBNP in the last 8760 hours.  HbA1C: No results for input(s): HGBA1C in the last 72 hours.  CBG: No results for input(s): GLUCAP in the last 168 hours.  Lipid Profile: No results for input(s): CHOL, HDL, LDLCALC, TRIG, CHOLHDL, LDLDIRECT in the last 72 hours.  Thyroid Function Tests:  Recent Labs  06/01/16 1041  TSH 4.675*    Anemia Panel:  Recent Labs  06/01/16 1355  VITAMINB12 394  FOLATE 17.3  FERRITIN 31  TIBC 393  IRON 156  RETICCTPCT 3.1    Urine analysis:    Component Value Date/Time   COLORURINE YELLOW 03/21/2013 2003   APPEARANCEUR CLEAR 03/21/2013 2003   LABSPEC 1.031 (H) 03/21/2013 2003   PHURINE 6.0 03/21/2013 2003   GLUCOSEU NEGATIVE 03/21/2013 2003   HGBUR MODERATE (A) 03/21/2013 2003   BILIRUBINUR NEGATIVE 03/21/2013 2003   KETONESUR NEGATIVE 03/21/2013 2003   PROTEINUR NEGATIVE 03/21/2013 2003   UROBILINOGEN 0.2  03/21/2013 2003   NITRITE NEGATIVE 03/21/2013 2003   LEUKOCYTESUR NEGATIVE 03/21/2013 2003    Sepsis Labs: Lactic Acid, Venous No results found for: LATICACIDVEN  MICROBIOLOGY: Recent Results (from the past 240 hour(s))  MRSA PCR Screening     Status: None   Collection Time: 06/01/16  9:27 AM  Result Value Ref Range Status   MRSA by PCR NEGATIVE NEGATIVE Final    Comment:        The GeneXpert MRSA Assay (FDA approved for NASAL specimens only), is one component of a comprehensive MRSA colonization surveillance program. It is not intended to diagnose MRSA infection nor to guide or monitor treatment for MRSA infections.     RADIOLOGY STUDIES/RESULTS: Dg Chest Portable 1 View  Result Date: 06/01/2016 CLINICAL DATA:  Nonproductive cough for 3 days. Left-sided chest pain tonight, radiating into the left arm. Dyspnea. EXAM: PORTABLE CHEST 1 VIEW COMPARISON:  09/05/2011 FINDINGS: Right-sided central line extends into the low SVC. The lungs are clear. Pulmonary vasculature is normal. No large effusions. Normal heart size. IMPRESSION: No acute cardiopulmonary findings. Electronically Signed   By: Ellery Plunkaniel R Mitchell M.D.   On: 06/01/2016 03:35   Ct Angio Chest/abd/pel For Dissection W And/or Wo Contrast  Result Date: 06/01/2016 CLINICAL DATA:  Constant chest pain, onset at 22:30. Radiating into the left arm and penetrating through to the back. Onset at rest. Dyspnea. EXAM: CT ANGIOGRAPHY CHEST, ABDOMEN AND PELVIS TECHNIQUE: Multidetector CT imaging through the chest, abdomen and pelvis was performed using the standard protocol during bolus administration of intravenous contrast. Multiplanar reconstructed images and MIPs were obtained and reviewed to evaluate the vascular anatomy. CONTRAST:  100 mL Isovue 370 intravenous COMPARISON:  Radiographs 06/01/2016 FINDINGS: CTA CHEST FINDINGS Cardiovascular: Preferential opacification of the thoracic aorta. No evidence of thoracic aortic aneurysm or  dissection. Normal heart size. No pericardial effusion. Good opacification of the pulmonary arteries. No embolism. Moderate atherosclerotic calcification of the coronary arteries and great vessel origins. Mediastinum/Nodes: No enlarged mediastinal, hilar, or axillary lymph nodes. Thyroid gland, trachea, and esophagus demonstrate no significant findings. Lungs/Pleura: Lungs are clear. No pleural effusion or pneumothorax. Musculoskeletal: No chest wall abnormality. No acute or significant osseous findings. Review of the MIP images confirms the above findings. CTA ABDOMEN AND PELVIS FINDINGS VASCULAR Aorta: Normal caliber aorta without aneurysm, dissection, vasculitis or significant stenosis. Celiac: Patent without evidence of aneurysm, dissection, vasculitis or significant stenosis. SMA: Patent without evidence of aneurysm, dissection, vasculitis or significant stenosis. Renals: Left renal artery is patent. Probable stenosis of the right renal artery in its proximal 1st cm. IMA: Patent without evidence of aneurysm, dissection, vasculitis or significant stenosis. Inflow: Distal aorta and common iliac arteries are heavily calcified but patent. External iliac and common femoral arteries are patent. Femoral bifurcations are patent. Veins: No obvious venous abnormality within the limitations of this arterial phase study. Review of the MIP images confirms the above findings. NON-VASCULAR Hepatobiliary: No focal liver abnormality is seen. No gallstones, gallbladder wall thickening, or biliary dilatation. Pancreas: Unremarkable. No pancreatic ductal dilatation or surrounding inflammatory changes. Spleen: Normal in size without focal abnormality. Adrenals/Urinary Tract: Adrenal glands are unremarkable. Kidneys are normal, without renal calculi, focal lesion, or hydronephrosis. Bladder is unremarkable. Stomach/Bowel: Stomach is within normal limits. Appendix is normal. Moderate colonic diverticulosis. No evidence of bowel wall  thickening, distention, or inflammatory changes. Lymphatic: No significant vascular findings are present. No enlarged abdominal or pelvic lymph nodes. Reproductive: Status post hysterectomy. No adnexal masses. Other: No focal inflammation.  No ascites. Musculoskeletal: No significant skeletal lead Review of the MIP images confirms the above findings. IMPRESSION: 1. No acute vascular abnormality. 2. Moderate atherosclerotic calcification of the aorta and coronary arteries. 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 3. Colonic diverticulosis. Electronically Signed   By: Ellery Plunkaniel R Mitchell M.D.   On: 06/01/2016 05:10     LOS: 0 days   Jeoffrey MassedGHIMIRE,Platon Arocho, MD  [facility] Pager:[phone]  If 7PM-7AM, please contact night-coverage [email or web address] Password [email or web address] 06/02/2016, 9:54 AM

## 2016-06-02 NOTE — Interval H&P Note (Signed)
Cath Lab Visit (complete for each Cath Lab visit)  Clinical Evaluation Leading to the Procedure:   ACS: Yes.    Non-ACS:    Anginal Classification: CCS IV  Anti-ischemic medical therapy: Minimal Therapy (1 class of medications)  Non-Invasive Test Results: No non-invasive testing performed  Prior CABG: No previous CABG      History and Physical Interval Note:  06/02/2016 3:03 PM  [name]  has presented today for surgery, with the diagnosis of cp  The various methods of treatment have been discussed with the patient and family. After consideration of risks, benefits and other options for treatment, the patient has consented to  Procedure(s): Left Heart Cath and Coronary Angiography (N/A) as a surgical intervention .  The patient's history has been reviewed, patient examined, no change in status, stable for surgery.  I have reviewed the patient's chart and labs.  Questions were answered to the patient's satisfaction.     [name]

## 2016-06-02 NOTE — Care Management Note (Signed)
Case Management Note  Patient Details  Name: [name] MRN: [id number] Date of Birth: 07/12/1950  Subjective/Objective:   S/p heart cath, on plavix,  NCM will cont to follow for dc needs.                  Action/Plan:   Expected Discharge Date:                  Expected Discharge Plan:  Home/Self Care  In-House Referral:     Discharge planning Services  CM Consult  Post Acute Care Choice:    Choice offered to:     DME Arranged:    DME Agency:     HH Arranged:    HH Agency:     Status of Service:  In process, will continue to follow  If discussed at Long Length of Stay Meetings, dates discussed:    Additional Comments:  Leone Havenaylor, Charlene Cowdrey Clinton, RN 06/02/2016, 6:27 PM

## 2016-06-02 NOTE — Progress Notes (Signed)
ANTICOAGULATION CONSULT NOTE - Follow Up Consult  Pharmacy Consult for heparin Indication: chest pain/ACS  Assessment: 66yo female on heparin for ACS r/o. Last heparin level remains therapeutic at 0.55. Hgb 9.6, plts wnl.  Goal of Therapy:  Heparin level 0.3-0.7 units/ml   Plan:  Continue heparin gtt at 1,300 units/hr Monitor daily heparin level, CBC, s/s of bleed F/U troponins, plans for cardiac cath, ECHO

## 2016-06-02 NOTE — Progress Notes (Signed)
Progress Note  Patient Name: [name] Date of Encounter: 06/02/2016  Primary Cardiologist: Purvis Sheffield  Subjective   Still complaining of 3/10 chest pain Had breakfast.   Inpatient Medications    Scheduled Meds: . aspirin EC  81 mg Oral Daily  . atorvastatin  80 mg Oral QPM  . benazepril  40 mg Oral Daily  . buPROPion  150 mg Oral Daily  . clopidogrel  75 mg Oral Daily  . diltiazem  180 mg Oral Daily  . DULoxetine  30 mg Oral BID  . gabapentin  300 mg Oral TID  . levothyroxine  50 mcg Oral QAC breakfast  . pantoprazole  40 mg Oral Q1200  . potassium chloride  10 mEq Oral Daily  . ranolazine  500 mg Oral BID  . traZODone  100 mg Oral QHS   Continuous Infusions: . heparin 1,300 Units/hr (06/02/16 0345)  . nitroGLYCERIN 5 mcg/min (06/01/16 2334)   PRN Meds: acetaminophen, albuterol, morphine injection, ondansetron **OR** ondansetron (ZOFRAN) IV   Vital Signs    Vitals:   06/02/16 0000 06/02/16 0312 06/02/16 0815 06/02/16 0911  BP: (!) 129/58 (!) 115/56 135/62 138/65  Pulse:  99  89  Resp:  18  (!) 21  Temp:  98.6 F (37 C) 98.6 F (37 C)   TempSrc:  Oral Oral   SpO2:  95%  98%  Weight:      Height:        Intake/Output Summary (Last 24 hours) at 06/02/16 0939 Last data filed at 06/02/16 0900  Gross per 24 hour  Intake          1186.82 ml  Output             1700 ml  Net          -513.18 ml   Filed Weights   06/01/16 0013 06/01/16 0923  Weight: 221 lb (100.2 kg) 213 lb (96.6 kg)    Telemetry    NSR frequent PAC;s 06/02/2016  - Personally Reviewed  ECG    NSR  RBBB frequent PAC;s 06/02/2016  Personally Reviewed  Physical Exam: BP 138/65   Pulse 89   Temp 98.6 F (37 C) (Oral)   Resp (!) 21   Ht 5' (1.524 m) Comment: patient states  Wt 213 lb (96.6 kg)   SpO2 98%   BMI 41.60 kg/m    Affect appropriate Healthy:  appears stated age HEENT: normal Neck supple with no adenopathy JVP normal no bruits no thyromegaly Lungs clear with no  wheezing and good diaphragmatic motion Heart:  S1/S2  SEM  murmur, no rub, gallop or click PMI normal Abdomen: benighn, BS positve, no tenderness, no AAA no bruit.  No HSM or HJR Distal pulses intact with no bruits No edema Neuro non-focal Skin warm and dry No muscular weakness Poor ulnar flow on right wrist with Allen's test   Labs    Chemistry Recent Labs Lab 06/01/16 0134 06/01/16 0412 06/02/16 0354  NA 124* 126* 126*  K 3.5 3.7 4.3  CL 90* 92* 93*  CO2 24  --  24  GLUCOSE 84 81 102*  BUN 15 15 20   CREATININE 0.86 0.80 0.73  CALCIUM 9.2  --  8.6*  PROT  --   --  6.2*  ALBUMIN  --   --  3.8  AST  --   --  15  ALT  --   --  11*  ALKPHOS  --   --  35*  BILITOT  --   --  0.5  GFRNONAA >60  --  >60  GFRAA >60  --  >60  ANIONGAP 10  --  9     Hematology Recent Labs Lab 06/01/16 0134 06/01/16 0412 06/01/16 1355 06/02/16 0354  WBC 7.5  --   --  10.5  RBC 3.27*  --  4.25 3.20*  HGB 10.2* 9.5*  --  9.6*  HCT 29.6* 28.0*  --  29.4*  MCV 90.5  --   --  91.9  MCH 31.2  --   --  30.0  MCHC 34.5  --   --  32.7  RDW 14.7  --   --  15.1  PLT 292  --   --  310    Cardiac Enzymes Recent Labs Lab 06/01/16 0738 06/01/16 1041 06/01/16 1355 06/01/16 2220  TROPONINI <0.03 0.03* 0.07* <0.03    Recent Labs Lab 06/01/16 0410  TROPIPOC 0.01     BNPNo results for input(s): BNP, PROBNP in the last 168 hours.   DDimer No results for input(s): DDIMER in the last 168 hours.   Radiology    Dg Chest Portable 1 View  Result Date: 06/01/2016 CLINICAL DATA:  Nonproductive cough for 3 days. Left-sided chest pain tonight, radiating into the left arm. Dyspnea. EXAM: PORTABLE CHEST 1 VIEW COMPARISON:  09/05/2011 FINDINGS: Right-sided central line extends into the low SVC. The lungs are clear. Pulmonary vasculature is normal. No large effusions. Normal heart size. IMPRESSION: No acute cardiopulmonary findings. Electronically Signed   By: Ellery Plunkaniel R Mitchell M.D.   On: 06/01/2016  03:35   Ct Angio Chest/abd/pel For Dissection W And/or Wo Contrast  Result Date: 06/01/2016 CLINICAL DATA:  Constant chest pain, onset at 22:30. Radiating into the left arm and penetrating through to the back. Onset at rest. Dyspnea. EXAM: CT ANGIOGRAPHY CHEST, ABDOMEN AND PELVIS TECHNIQUE: Multidetector CT imaging through the chest, abdomen and pelvis was performed using the standard protocol during bolus administration of intravenous contrast. Multiplanar reconstructed images and MIPs were obtained and reviewed to evaluate the vascular anatomy. CONTRAST:  100 mL Isovue 370 intravenous COMPARISON:  Radiographs 06/01/2016 FINDINGS: CTA CHEST FINDINGS Cardiovascular: Preferential opacification of the thoracic aorta. No evidence of thoracic aortic aneurysm or dissection. Normal heart size. No pericardial effusion. Good opacification of the pulmonary arteries. No embolism. Moderate atherosclerotic calcification of the coronary arteries and great vessel origins. Mediastinum/Nodes: No enlarged mediastinal, hilar, or axillary lymph nodes. Thyroid gland, trachea, and esophagus demonstrate no significant findings. Lungs/Pleura: Lungs are clear. No pleural effusion or pneumothorax. Musculoskeletal: No chest wall abnormality. No acute or significant osseous findings. Review of the MIP images confirms the above findings. CTA ABDOMEN AND PELVIS FINDINGS VASCULAR Aorta: Normal caliber aorta without aneurysm, dissection, vasculitis or significant stenosis. Celiac: Patent without evidence of aneurysm, dissection, vasculitis or significant stenosis. SMA: Patent without evidence of aneurysm, dissection, vasculitis or significant stenosis. Renals: Left renal artery is patent. Probable stenosis of the right renal artery in its proximal 1st cm. IMA: Patent without evidence of aneurysm, dissection, vasculitis or significant stenosis. Inflow: Distal aorta and common iliac arteries are heavily calcified but patent. External iliac and  common femoral arteries are patent. Femoral bifurcations are patent. Veins: No obvious venous abnormality within the limitations of this arterial phase study. Review of the MIP images confirms the above findings. NON-VASCULAR Hepatobiliary: No focal liver abnormality is seen. No gallstones, gallbladder wall thickening, or biliary dilatation. Pancreas: Unremarkable. No pancreatic ductal dilatation or surrounding inflammatory  changes. Spleen: Normal in size without focal abnormality. Adrenals/Urinary Tract: Adrenal glands are unremarkable. Kidneys are normal, without renal calculi, focal lesion, or hydronephrosis. Bladder is unremarkable. Stomach/Bowel: Stomach is within normal limits. Appendix is normal. Moderate colonic diverticulosis. No evidence of bowel wall thickening, distention, or inflammatory changes. Lymphatic: No significant vascular findings are present. No enlarged abdominal or pelvic lymph nodes. Reproductive: Status post hysterectomy. No adnexal masses. Other: No focal inflammation.  No ascites. Musculoskeletal: No significant skeletal lead Review of the MIP images confirms the above findings. IMPRESSION: 1. No acute vascular abnormality. 2. Moderate atherosclerotic calcification of the aorta and coronary arteries. 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 3. Colonic diverticulosis. Electronically Signed   By: Ellery Plunk M.D.   On: 06/01/2016 05:10    Cardiac Studies   08/24/15 Myovue personally reviewed normal no ischemia 03/21/13 Echo EF 60-65% ? Subvalvular gradient   Patient Profile     66 y.o. female known CAD with DES in all 3 major epicardial vessels patent by  Cath in 2014  Non ischemic myovue June 2017 Ongoing SSCP on nitro and heparin CT negative dissection.   Assessment & Plan    Atrial Tachycardia:  cardizem increased may benefit from amiodarone and outpatient holter  Murmur: echo pending history of subvalvular gradient   Chest Pain : known CAD discussed  options with her at length favor diagnostic cath today Previously done from [facility] ? Because Allens' test positive on right. Continue heparin and nitro Lab called orders written  Spent over 35 minutes counseling and direct care with patient and spouse regarding decision to proceed With cath and risks involved   Signed, Charlton Haws, MD  06/02/2016, 9:39 AM

## 2016-06-02 NOTE — Progress Notes (Addendum)
Site area: RFA Site Prior to Removal:  Level 0 Pressure Applied For:25 min Manual: yes   Patient Status During Pull: stable  Post Pull Site:  Level 0 Post Pull Instructions Given: yes  Post Pull Pulses Present: palpable Dressing Applied:  [place] Bedrest begins @ 1645 till 2045 Comments:

## 2016-06-02 NOTE — Progress Notes (Signed)
  Echocardiogram 2D Echocardiogram has been performed.  Arvil ChacoFoster, Dantavious Snowball 06/02/2016, 2:34 PM

## 2016-06-03 ENCOUNTER — Encounter (HOSPITAL_COMMUNITY): Payer: Self-pay | Admitting: Interventional Cardiology

## 2016-06-03 DIAGNOSIS — E039 Hypothyroidism, unspecified: Secondary | ICD-10-CM

## 2016-06-03 LAB — BASIC METABOLIC PANEL
ANION GAP: 10 (ref 5–15)
ANION GAP: 11 (ref 5–15)
BUN: 15 mg/dL (ref 6–20)
BUN: 17 mg/dL (ref 6–20)
CALCIUM: 8.8 mg/dL — AB (ref 8.9–10.3)
CALCIUM: 8.7 mg/dL — AB (ref 8.9–10.3)
CHLORIDE: 99 mmol/L — AB (ref 101–111)
CHLORIDE: 94 mmol/L — AB (ref 101–111)
CO2: 19 mmol/L — AB (ref 22–32)
CO2: 27 mmol/L (ref 22–32)
CREATININE: 0.68 mg/dL (ref 0.44–1.00)
Creatinine, Ser: 0.67 mg/dL (ref 0.44–1.00)
GFR calc Af Amer: >60 mL/min (ref >60)
GFR calc Af Amer: >60 mL/min (ref >60)
GFR calc non Af Amer: >60 mL/min (ref >60)
GFR calc non Af Amer: >60 mL/min (ref >60)
GLUCOSE: 98 mg/dL (ref 65–99)
GLUCOSE: 109 mg/dL — AB (ref 65–99)
Potassium: 5.7 mmol/L — ABNORMAL HIGH (ref 3.5–5.1)
Potassium: 4.2 mmol/L (ref 3.5–5.1)
Sodium: 128 mmol/L — ABNORMAL LOW (ref 135–145)
Sodium: 132 mmol/L — ABNORMAL LOW (ref 135–145)

## 2016-06-03 LAB — CBC
HEMATOCRIT: 24.8 % — AB (ref 36.0–46.0)
HEMOGLOBIN: 8.1 g/dL — AB (ref 12.0–15.0)
MCH: 30 pg (ref 26.0–34.0)
MCHC: 32.7 g/dL (ref 30.0–36.0)
MCV: 91.9 fL (ref 78.0–100.0)
Platelets: 259 10*3/uL (ref 150–400)
RBC: 2.7 MIL/uL — ABNORMAL LOW (ref 3.87–5.11)
RDW: 15.4 % (ref 11.5–15.5)
WBC: 7.6 10*3/uL (ref 4.0–10.5)

## 2016-06-03 MED ORDER — DILTIAZEM HCL ER COATED BEADS 180 MG PO CP24: 180.0000 mg | ORAL_CAPSULE | Freq: Every day | ORAL | 0 refills | Status: AC

## 2016-06-03 MED ORDER — POTASSIUM CHLORIDE CRYS ER 10 MEQ PO TBCR: 10.0000 meq | EXTENDED_RELEASE_TABLET | Freq: Every day | ORAL | 3 refills | Status: AC

## 2016-06-03 MED ORDER — DIPHENHYDRAMINE HCL 25 MG PO TABS: 25.0000 mg | ORAL_TABLET | Freq: Two times a day (BID) | ORAL | 0 refills | Status: AC | PRN

## 2016-06-03 MED ORDER — SODIUM POLYSTYRENE SULFONATE 15 GM/60ML PO SUSP
30.0000 g | Freq: Once | ORAL | Status: AC
  Administered 2016-06-03: 09:00:00 30 g via ORAL
  Filled 2016-06-03: qty 120

## 2016-06-03 MED ORDER — FUROSEMIDE 40 MG PO TABS
40.0000 mg | ORAL_TABLET | Freq: Every day | ORAL | Status: DC
  Administered 2016-06-03: 09:00:00 40 mg via ORAL
  Filled 2016-06-03: qty 1

## 2016-06-03 MED ORDER — FUROSEMIDE 40 MG PO TABS: 40.0000 mg | ORAL_TABLET | Freq: Every day | ORAL | 0 refills | Status: AC

## 2016-06-03 NOTE — Progress Notes (Signed)
Progress Note  Patient Name: [name] Date of Encounter: 06/03/2016  Primary Cardiologist: Purvis Sheffield  Subjective   No complaints K high   Inpatient Medications    Scheduled Meds: . aspirin EC  81 mg Oral Daily  . atorvastatin  80 mg Oral QPM  . benazepril  40 mg Oral Daily  . buPROPion  150 mg Oral Daily  . clopidogrel  75 mg Oral Daily  . diltiazem  180 mg Oral Daily  . DULoxetine  30 mg Oral BID  . furosemide  40 mg Oral Daily  . gabapentin  300 mg Oral TID  . levothyroxine  50 mcg Oral QAC breakfast  . pantoprazole  40 mg Oral Q1200  . ranolazine  500 mg Oral BID  . sodium chloride flush  3 mL Intravenous Q12H  . traZODone  100 mg Oral QHS   Continuous Infusions: . nitroGLYCERIN Stopped (06/02/16 1750)   PRN Meds: sodium chloride, acetaminophen, acetaminophen, albuterol, morphine injection, ondansetron **OR** ondansetron (ZOFRAN) IV, sodium chloride flush   Vital Signs    Vitals:   06/03/16 0430 06/03/16 0630 06/03/16 0645 06/03/16 0700  BP: 132/81   (!) 150/61  Pulse: 95 84  84  Resp: 17 16  15   Temp: 98.3 F (36.8 C)   98 F (36.7 C)  TempSrc: Oral   Oral  SpO2: 94% 97%  96%  Weight:   212 lb 15.4 oz (96.6 kg)   Height:        Intake/Output Summary (Last 24 hours) at 06/03/16 0848 Last data filed at 06/03/16 0800  Gross per 24 hour  Intake          1613.94 ml  Output             3275 ml  Net         -1661.06 ml   Filed Weights   06/01/16 0013 06/01/16 0923 06/03/16 0645  Weight: 221 lb (100.2 kg) 213 lb (96.6 kg) 212 lb 15.4 oz (96.6 kg)    Telemetry    NSR frequent PAC;s 06/03/2016  - Personally Reviewed  ECG    NSR  RBBB frequent PAC;s 06/03/2016  Personally Reviewed  Physical Exam: BP (!) 150/61 (BP Location: Right Arm)   Pulse 84   Temp 98 F (36.7 C) (Oral)   Resp 15   Ht 5' (1.524 m) Comment: patient states  Wt 212 lb 15.4 oz (96.6 kg)   SpO2 96%   BMI 41.59 kg/m    Affect appropriate Healthy:  appears stated  age HEENT: normal Neck supple with no adenopathy JVP normal no bruits no thyromegaly Lungs clear with no wheezing and good diaphragmatic motion Heart:  S1/S2  SEM  murmur, no rub, gallop or click PMI normal Abdomen: benighn, BS positve, no tenderness, no AAA no bruit.  No HSM or HJR Distal pulses intact with no bruits No edema Neuro non-focal Skin warm and dry No muscular weakness Poor ulnar flow on right wrist with Allen's test   Labs    Chemistry  Recent Labs Lab 06/01/16 0134 06/01/16 0412 06/02/16 0354 06/03/16 0650  NA 124* 126* 126* 128*  K 3.5 3.7 4.3 5.7*  CL 90* 92* 93* 99*  CO2 24  --  24 19*  GLUCOSE 84 81 102* 98  BUN 15 15 20 15   CREATININE 0.86 0.80 0.73 0.68  CALCIUM 9.2  --  8.6* 8.8*  PROT  --   --  6.2*  --   ALBUMIN  --   --  3.8  --   AST  --   --  15  --   ALT  --   --  11*  --   ALKPHOS  --   --  35*  --   BILITOT  --   --  0.5  --   GFRNONAA >60  --  >60 >60  GFRAA >60  --  >60 >60  ANIONGAP 10  --  9 10     Hematology  Recent Labs Lab 06/01/16 0134 06/01/16 0412 06/01/16 1355 06/02/16 0354 06/03/16 0215  WBC 7.5  --   --  10.5 7.6  RBC 3.27*  --  4.25 3.20* 2.70*  HGB 10.2* 9.5*  --  9.6* 8.1*  HCT 29.6* 28.0*  --  29.4* 24.8*  MCV 90.5  --   --  91.9 91.9  MCH 31.2  --   --  30.0 30.0  MCHC 34.5  --   --  32.7 32.7  RDW 14.7  --   --  15.1 15.4  PLT 292  --   --  310 259    Cardiac Enzymes  Recent Labs Lab 06/01/16 0738 06/01/16 1041 06/01/16 1355 06/01/16 2220  TROPONINI <0.03 0.03* 0.07* <0.03     Recent Labs Lab 06/01/16 0410  TROPIPOC 0.01     BNPNo results for input(s): BNP, PROBNP in the last 168 hours.   DDimer No results for input(s): DDIMER in the last 168 hours.   Radiology    No results found.  Cardiac Studies   08/24/15 Myovue personally reviewed normal no ischemia 03/21/13 Echo EF 60-65% ? Subvalvular gradient   Patient Profile     66 y.o. female known CAD with DES in all 3 major  epicardial vessels patent by  Cath in 2014  Non ischemic myovue June 2017 Ongoing SSCP on nitro and heparin CT negative dissection.   Assessment & Plan    Atrial Tachycardia:  cardizem increased may benefit from amiodarone and outpatient holter  Murmur: outflow tract gradient by echo consider adding beta blocker as outpatient   Chest Pain : reviewed cath films stent patent stable continue DAT and statin   OK for d/c latter today if K comes down rhythm is stable   Spent over 25 minutes counseling and direct care with patient and spouse regarding decision to proceed With cath and risks involved   Signed, Charlton HawsPeter Adreanne Yono, MD  06/03/2016, 8:48 AM

## 2016-06-03 NOTE — Care Management Note (Signed)
Case Management Note  Patient Details  Name: Linna Darnereresa B Malenfant MRN: [id number] Date of Birth: 11/04/1950  Subjective/Objective:   S/p heart cath, on plavix, k is evlevated  5.7,  Now down to 4.2.  Patient for dc later today.  NCM will cont to follow for dc needs.                Action/Plan:   Expected Discharge Date:                  Expected Discharge Plan:  Home/Self Care  In-House Referral:     Discharge planning Services  CM Consult  Post Acute Care Choice:    Choice offered to:     DME Arranged:    DME Agency:     HH Arranged:    HH Agency:     Status of Service:  Completed, signed off  If discussed at [employer] Length of Stay Meetings, dates discussed:    Additional Comments:  Leone Havenaylor, Felesia Stahlecker Clinton, RN 06/03/2016, 2:18 PM

## 2016-06-03 NOTE — Discharge Summary (Signed)
PATIENT DETAILS Name: [name] Age: 66 y.o. Sex: female Date of Birth: [birth year] MRN: [id number]. Admitting Physician: Meredeth Ide, MD NWG:NFAO Tillie Rung, MD  Admit Date: 06/01/2016 Discharge date: 06/03/2016  Recommendations for Outpatient Follow-up:  1. Follow up with PCP in 1-2 weeks 2. Please obtain BMP/CBC in one week 3. Please ensure follow-up with cardiology. 4. TSH very minimally elevated, suggest repeat in 3 months and adjust dosing of levothyroxine accordingly.  Admitted From:  Home  Disposition: Home    Home Health: No  Equipment/Devices: None  Discharge Condition: Stable  CODE STATUS: FULL CODE  Diet recommendation:  Heart Healthy   Brief Summary: See H&P, Labs, Consult and Test reports for all details in brief, Patient is a 66 y.o. female with hx of CAD s/p last PCI in 2014 admitted for evaluation of chest pain and palpitations. See below for further details.   Brief Hospital Course: Atypical chest pain:  usually thought to have unstable angina, cardiac enzymes were negative, underwent a LHC on 3/13 that shows patent stents. No further chest pain, recommendations from cardiology are to continue with dual antiplatelet therapy with aspirin and Plavix. Patient will need to follow-up with her primary cardiologist in the next few weeks. Note CTA Chest/Abd negative for PE and dissection  Atrial Tachycardia:in NSR-continue Cardizem-dose adjusted to 180 mg. Consider addition of beta blocker in the outpatient setting  Hyponatremia: euvolemic on exam-suspect SIADH-seems to have chronic hyponatremia at baseline-avoid excessive fluids-she is euvolemic on exam-have started Lasix. Have emphasized importance of fluid restriction and to avoid excessive water intake.   Mild hyperkalemia: Resolved after Kayexalate. Will be on Lasix-cautiously continue with benazepril in the outpatient setting. Recheck electrolytes at next visit with PCP. If hyperkalemia reoccurs,  consider discontinuation of benazepril.  HTN: controlled-continue cardizem and Benazepril  Dyslipidemia: continue Statin  Hypothyroidism-continue Synthroid-TSH only minimally elevated, hold off on adjusting Synthroid dosing at this time-not known when it was last adjusted deferred to PCP.  Anemia: normocytic-follow anemia panel.  GERD:PPI  COPD: lungs clear-continue prn bronchodilators  Morbid Obesity  Procedures/Studies: [facility] 3/12  Discharge Diagnoses:  Active Problems:   Hypothyroidism   Essential hypertension   Aortic valve disorder   Chest pain   Hyponatremia   Unstable angina [facility])   Atrial tachycardia [facility])   Discharge Instructions:  Activity:  As tolerated with Full fall precautions use walker/cane & assistance as needed  Discharge Instructions    Diet - low sodium heart healthy    Complete by:  As directed    Discharge instructions    Complete by:  As directed    Follow with Primary MD  Toma Deiters, MD in 1 week  Follow with a primary cardiologist in 1-2 weeks  Please get a complete blood count and chemistry panel checked by your Primary MD at your next visit, and again as instructed by your Primary MD.  Get Medicines reviewed and adjusted: Please take all your medications with you for your next visit with your Primary MD  Laboratory/radiological data: Please request your Primary MD to go over all hospital tests and procedure/radiological results at the follow up, please ask your Primary MD to get all Hospital records sent to his/her office.  In some cases, they will be blood work, cultures and biopsy results pending at the time of your discharge. Please request that your primary care M.D. follows up on these results.  Also Note the following: If you experience worsening of your admission symptoms, develop shortness  of breath, life threatening emergency, suicidal or homicidal thoughts you must seek medical attention immediately by calling 911  or calling your MD immediately  if symptoms less severe.  You must read complete instructions/literature along with all the possible adverse reactions/side effects for all the Medicines you take and that have been prescribed to you. Take any new Medicines after you have completely understood and accpet all the possible adverse reactions/side effects.   Do not drive when taking Pain medications or sleeping medications (Benzodaizepines)  Do not take more than prescribed Pain, Sleep and Anxiety Medications. It is not advisable to combine anxiety,sleep and pain medications without talking with your primary care practitioner  Special Instructions: If you have smoked or chewed Tobacco  in the last 2 yrs please stop smoking, stop any regular Alcohol  and or any Recreational drug use.  Wear Seat belts while driving.  Please note: You were cared for by a hospitalist during your hospital stay. Once you are discharged, your primary care physician will handle any further medical issues. Please note that NO REFILLS for any discharge medications will be authorized once you are discharged, as it is imperative that you return to your primary care physician (or establish a relationship with a primary care physician if you do not have one) for your post hospital discharge needs so that they can reassess your need for medications and monitor your lab values.   Increase activity slowly    Complete by:  As directed      Allergies as of 06/03/2016   No Known Allergies     Medication List    TAKE these medications   albuterol (2.5 MG/3ML) 0.083% nebulizer solution Commonly known as:  PROVENTIL Take 2.5 mg by nebulization every 6 (six) hours as needed for wheezing or shortness of breath.   aspirin EC 81 MG tablet Take 81 mg by mouth daily.   atorvastatin 80 MG tablet Commonly known as:  LIPITOR Take 1 tablet (80 mg total) by mouth daily.   benazepril 40 MG tablet Commonly known as:  LOTENSIN Take 1  tablet by mouth daily.   buPROPion 150 MG 24 hr tablet Commonly known as:  WELLBUTRIN XL Take 150 mg by mouth daily.   clopidogrel 75 MG tablet Commonly known as:  PLAVIX Take 1 tablet (75 mg total) by mouth daily.   diltiazem 180 MG 24 hr capsule Commonly known as:  CARDIZEM CD Take 1 capsule (180 mg total) by mouth daily. What changed:  medication strength  how much to take   diphenhydrAMINE 25 MG tablet Commonly known as:  BENADRYL Take 1 tablet (25 mg total) by mouth 2 (two) times daily as needed for itching or allergies. What changed:  when to take this  reasons to take this   docusate sodium 100 MG capsule Commonly known as:  COLACE Take 100 mg by mouth daily as needed for mild constipation.   doxepin 10 MG capsule Commonly known as:  SINEQUAN Take 10 mg by mouth at bedtime.   DULoxetine 30 MG capsule Commonly known as:  CYMBALTA Take 30 mg by mouth 2 (two) times daily.   furosemide 40 MG tablet Commonly known as:  LASIX Take 1 tablet (40 mg total) by mouth daily. Start taking on:  06-22-16   gabapentin 300 MG capsule Commonly known as:  NEURONTIN Take 1 capsule by mouth 3 (three) times daily.   gentamicin 0.3 % ophthalmic solution Commonly known as:  GARAMYCIN Place 1 drop into both eyes  every 8 (eight) hours.   isosorbide mononitrate 60 MG 24 hr tablet Commonly known as:  IMDUR Take 1 tablet by mouth  daily   levothyroxine 50 MCG tablet Commonly known as:  SYNTHROID, LEVOTHROID Take 1 tablet (50 mcg total) by mouth daily.   meloxicam 15 MG tablet Commonly known as:  MOBIC Take 15 mg by mouth daily.   methocarbamol 750 MG tablet Commonly known as:  ROBAXIN Take 750 mg by mouth 4 (four) times daily.   nitroGLYCERIN 0.4 MG SL tablet Commonly known as:  NITROSTAT Place 1 tablet (0.4 mg total) under the tongue every 5 (five) minutes as needed for chest pain.   potassium chloride 10 MEQ tablet Commonly known as:  K-DUR,KLOR-CON Take 1  tablet (10 mEq total) by mouth daily. Start taking on:  06/05/2016 What changed:  See the new instructions.   ranitidine 300 MG tablet Commonly known as:  ZANTAC Take 1 tablet by mouth 2 (two) times daily.   ranolazine 500 MG 12 hr tablet Commonly known as:  RANEXA Take 1 tablet (500 mg total) by mouth 2 (two) times daily.      Follow-up Information    Toma Deiters, MD. Schedule an appointment as soon as possible for a visit in 1 week(s).   Specialty:  Internal Medicine Contact information: [address] [place] [address] [phone]        Prentice Docker, MD. Schedule an appointment as soon as possible for a visit in 2 week(s).   Specialty:  Cardiology Contact information: [address] [place] [address] [phone]          No Known Allergies  Consultations:   cardiology  Other Procedures/Studies: Dg Chest Portable 1 View  Result Date: 06/01/2016 CLINICAL DATA:  Nonproductive cough for 3 days. Left-sided chest pain tonight, radiating into the left arm. Dyspnea. EXAM: PORTABLE CHEST 1 VIEW COMPARISON:  09/05/2011 FINDINGS: Right-sided central line extends into the low SVC. The lungs are clear. Pulmonary vasculature is normal. No large effusions. Normal heart size. IMPRESSION: No acute cardiopulmonary findings. Electronically Signed   By: Ellery Plunk M.D.   On: 06/01/2016 03:35   Ct Angio Chest/abd/pel For Dissection W And/or Wo Contrast  Result Date: 06/01/2016 CLINICAL DATA:  Constant chest pain, onset at 22:30. Radiating into the left arm and penetrating through to the back. Onset at rest. Dyspnea. EXAM: CT ANGIOGRAPHY CHEST, ABDOMEN AND PELVIS TECHNIQUE: Multidetector CT imaging through the chest, abdomen and pelvis was performed using the standard protocol during bolus administration of intravenous contrast. Multiplanar reconstructed images and MIPs were obtained and reviewed to evaluate the vascular anatomy. CONTRAST:  100 mL Isovue 370  intravenous COMPARISON:  Radiographs 06/01/2016 FINDINGS: CTA CHEST FINDINGS Cardiovascular: Preferential opacification of the thoracic aorta. No evidence of thoracic aortic aneurysm or dissection. Normal heart size. No pericardial effusion. Good opacification of the pulmonary arteries. No embolism. Moderate atherosclerotic calcification of the coronary arteries and great vessel origins. Mediastinum/Nodes: No enlarged mediastinal, hilar, or axillary lymph nodes. Thyroid gland, trachea, and esophagus demonstrate no significant findings. Lungs/Pleura: Lungs are clear. No pleural effusion or pneumothorax. Musculoskeletal: No chest wall abnormality. No acute or significant osseous findings. Review of the MIP images confirms the above findings. CTA ABDOMEN AND PELVIS FINDINGS VASCULAR Aorta: Normal caliber aorta without aneurysm, dissection, vasculitis or significant stenosis. Celiac: Patent without evidence of aneurysm, dissection, vasculitis or significant stenosis. SMA: Patent without evidence of aneurysm, dissection, vasculitis or significant stenosis. Renals: Left renal artery is  patent. Probable stenosis of the right renal artery in its proximal 1st cm. IMA: Patent without evidence of aneurysm, dissection, vasculitis or significant stenosis. Inflow: Distal aorta and common iliac arteries are heavily calcified but patent. External iliac and common femoral arteries are patent. Femoral bifurcations are patent. Veins: No obvious venous abnormality within the limitations of this arterial phase study. Review of the MIP images confirms the above findings. NON-VASCULAR Hepatobiliary: No focal liver abnormality is seen. No gallstones, gallbladder wall thickening, or biliary dilatation. Pancreas: Unremarkable. No pancreatic ductal dilatation or surrounding inflammatory changes. Spleen: Normal in size without focal abnormality. Adrenals/Urinary Tract: Adrenal glands are unremarkable. Kidneys are normal, without renal calculi,  focal lesion, or hydronephrosis. Bladder is unremarkable. Stomach/Bowel: Stomach is within normal limits. Appendix is normal. Moderate colonic diverticulosis. No evidence of bowel wall thickening, distention, or inflammatory changes. Lymphatic: No significant vascular findings are present. No enlarged abdominal or pelvic lymph nodes. Reproductive: Status post hysterectomy. No adnexal masses. Other: No focal inflammation.  No ascites. Musculoskeletal: No significant skeletal lead Review of the MIP images confirms the above findings. IMPRESSION: 1. No acute vascular abnormality. 2. Moderate atherosclerotic calcification of the aorta and coronary arteries. 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 3. Colonic diverticulosis. Electronically Signed   By: Ellery Plunk M.D.   On: 06/01/2016 05:10      TODAY-DAY OF DISCHARGE:  Subjective:   [name] today has no headache,no chest abdominal pain,no new weakness tingling or numbness, feels much better wants to go home today.   Objective:   Blood pressure (!) 158/61, pulse 93, temperature 98.6 F (37 C), temperature source Oral, resp. rate 16, height 5' (1.524 m), weight 96.6 kg (212 lb 15.4 oz), SpO2 96 %.  Intake/Output Summary (Last 24 hours) at 06/03/16 1208 Last data filed at 06/03/16 1206  Gross per 24 hour  Intake          1152.44 ml  Output             3850 ml  Net         -2697.56 ml   Filed Weights   06/01/16 0013 06/01/16 0923 06/03/16 0645  Weight: 100.2 kg (221 lb) 96.6 kg (213 lb) 96.6 kg (212 lb 15.4 oz)    Exam: Awake Alert, Oriented *3, No new F.N deficits, Normal affect [place].AT,PERRAL Supple Neck,No JVD, No cervical lymphadenopathy appriciated.  Symmetrical Chest wall movement, Good air movement bilaterally, CTAB RRR,No Gallops,Rubs or new Murmurs, No Parasternal Heave +ve B.Sounds, Abd Soft, Non tender, No organomegaly appriciated, No rebound -guarding or rigidity. No Cyanosis, Clubbing or edema, No new  Rash or bruise   PERTINENT RADIOLOGIC STUDIES: Dg Chest Portable 1 View  Result Date: 06/01/2016 CLINICAL DATA:  Nonproductive cough for 3 days. Left-sided chest pain tonight, radiating into the left arm. Dyspnea. EXAM: PORTABLE CHEST 1 VIEW COMPARISON:  09/05/2011 FINDINGS: Right-sided central line extends into the low SVC. The lungs are clear. Pulmonary vasculature is normal. No large effusions. Normal heart size. IMPRESSION: No acute cardiopulmonary findings. Electronically Signed   By: Ellery Plunk M.D.   On: 06/01/2016 03:35   Ct Angio Chest/abd/pel For Dissection W And/or Wo Contrast  Result Date: 06/01/2016 CLINICAL DATA:  Constant chest pain, onset at 22:30. Radiating into the left arm and penetrating through to the back. Onset at rest. Dyspnea. EXAM: CT ANGIOGRAPHY CHEST, ABDOMEN AND PELVIS TECHNIQUE: Multidetector CT imaging through the chest, abdomen and pelvis was performed using the standard protocol during bolus administration of  intravenous contrast. Multiplanar reconstructed images and MIPs were obtained and reviewed to evaluate the vascular anatomy. CONTRAST:  100 mL Isovue 370 intravenous COMPARISON:  Radiographs 06/01/2016 FINDINGS: CTA CHEST FINDINGS Cardiovascular: Preferential opacification of the thoracic aorta. No evidence of thoracic aortic aneurysm or dissection. Normal heart size. No pericardial effusion. Good opacification of the pulmonary arteries. No embolism. Moderate atherosclerotic calcification of the coronary arteries and great vessel origins. Mediastinum/Nodes: No enlarged mediastinal, hilar, or axillary lymph nodes. Thyroid gland, trachea, and esophagus demonstrate no significant findings. Lungs/Pleura: Lungs are clear. No pleural effusion or pneumothorax. Musculoskeletal: No chest wall abnormality. No acute or significant osseous findings. Review of the MIP images confirms the above findings. CTA ABDOMEN AND PELVIS FINDINGS VASCULAR Aorta: Normal caliber aorta  without aneurysm, dissection, vasculitis or significant stenosis. Celiac: Patent without evidence of aneurysm, dissection, vasculitis or significant stenosis. SMA: Patent without evidence of aneurysm, dissection, vasculitis or significant stenosis. Renals: Left renal artery is patent. Probable stenosis of the right renal artery in its proximal 1st cm. IMA: Patent without evidence of aneurysm, dissection, vasculitis or significant stenosis. Inflow: Distal aorta and common iliac arteries are heavily calcified but patent. External iliac and common femoral arteries are patent. Femoral bifurcations are patent. Veins: No obvious venous abnormality within the limitations of this arterial phase study. Review of the MIP images confirms the above findings. NON-VASCULAR Hepatobiliary: No focal liver abnormality is seen. No gallstones, gallbladder wall thickening, or biliary dilatation. Pancreas: Unremarkable. No pancreatic ductal dilatation or surrounding inflammatory changes. Spleen: Normal in size without focal abnormality. Adrenals/Urinary Tract: Adrenal glands are unremarkable. Kidneys are normal, without renal calculi, focal lesion, or hydronephrosis. Bladder is unremarkable. Stomach/Bowel: Stomach is within normal limits. Appendix is normal. Moderate colonic diverticulosis. No evidence of bowel wall thickening, distention, or inflammatory changes. Lymphatic: No significant vascular findings are present. No enlarged abdominal or pelvic lymph nodes. Reproductive: Status post hysterectomy. No adnexal masses. Other: No focal inflammation.  No ascites. Musculoskeletal: No significant skeletal lead Review of the MIP images confirms the above findings. IMPRESSION: 1. No acute vascular abnormality. 2. Moderate atherosclerotic calcification of the aorta and coronary arteries. 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 3. Colonic diverticulosis. Electronically Signed   By: Ellery Plunk M.D.   On: 06/01/2016 05:10       PERTINENT LAB RESULTS: CBC:  Recent Labs  06/02/16 0354 06/03/16 0215  WBC 10.5 7.6  HGB 9.6* 8.1*  HCT 29.4* 24.8*  PLT 310 259   CMET CMP     Component Value Date/Time   NA 128 (L) 06/03/2016 0650   K 5.7 (H) 06/03/2016 0650   CL 99 (L) 06/03/2016 0650   CO2 19 (L) 06/03/2016 0650   GLUCOSE 98 06/03/2016 0650   BUN 15 06/03/2016 0650   CREATININE 0.68 06/03/2016 0650   CALCIUM 8.8 (L) 06/03/2016 0650   PROT 6.2 (L) 06/02/2016 0354   ALBUMIN 3.8 06/02/2016 0354   AST 15 06/02/2016 0354   ALT 11 (L) 06/02/2016 0354   ALKPHOS 35 (L) 06/02/2016 0354   BILITOT 0.5 06/02/2016 0354   GFRNONAA >60 06/03/2016 0650   GFRAA >60 06/03/2016 0650    GFR Estimated Creatinine Clearance: 73.9 mL/min (by C-G formula based on SCr of 0.68 mg/dL). No results for input(s): LIPASE, AMYLASE in the last 72 hours.  Recent Labs  06/01/16 1041 06/01/16 1355 06/01/16 2220  TROPONINI 0.03* 0.07* <0.03   Invalid input(s): POCBNP No results for input(s): DDIMER in the last 72 hours. No results  for input(s): HGBA1C in the last 72 hours. No results for input(s): CHOL, HDL, LDLCALC, TRIG, CHOLHDL, LDLDIRECT in the last 72 hours.  Recent Labs  06/01/16 1041  TSH 4.675*    Recent Labs  06/01/16 1355  VITAMINB12 394  FOLATE 17.3  FERRITIN 31  TIBC 393  IRON 156  RETICCTPCT 3.1   Coags:  Recent Labs  06/01/16 0134  INR 1.03   Microbiology: Recent Results (from the past 240 hour(s))  MRSA PCR Screening     Status: None   Collection Time: 06/01/16  9:27 AM  Result Value Ref Range Status   MRSA by PCR NEGATIVE NEGATIVE Final    Comment:        The GeneXpert MRSA Assay (FDA approved for NASAL specimens only), is one component of a comprehensive MRSA colonization surveillance program. It is not intended to diagnose MRSA infection nor to guide or monitor treatment for MRSA infections.     FURTHER DISCHARGE INSTRUCTIONS:  Get Medicines reviewed and  adjusted: Please take all your medications with you for your next visit with your Primary MD  Laboratory/radiological data: Please request your Primary MD to go over all hospital tests and procedure/radiological results at the follow up, please ask your Primary MD to get all Hospital records sent to his/her office.  In some cases, they will be blood work, cultures and biopsy results pending at the time of your discharge. Please request that your primary care M.D. goes through all the records of your hospital data and follows up on these results.  Also Note the following: If you experience worsening of your admission symptoms, develop shortness of breath, life threatening emergency, suicidal or homicidal thoughts you must seek medical attention immediately by calling 911 or calling your MD immediately  if symptoms less severe.  You must read complete instructions/literature along with all the possible adverse reactions/side effects for all the Medicines you take and that have been prescribed to you. Take any new Medicines after you have completely understood and accpet all the possible adverse reactions/side effects.   Do not drive when taking Pain medications or sleeping medications (Benzodaizepines)  Do not take more than prescribed Pain, Sleep and Anxiety Medications. It is not advisable to combine anxiety,sleep and pain medications without talking with your primary care practitioner  Special Instructions: If you have smoked or chewed Tobacco  in the last 2 yrs please stop smoking, stop any regular Alcohol  and or any Recreational drug use.  Wear Seat belts while driving.  Please note: You were cared for by a hospitalist during your hospital stay. Once you are discharged, your primary care physician will handle any further medical issues. Please note that NO REFILLS for any discharge medications will be authorized once you are discharged, as it is imperative that you return to your primary  care physician (or establish a relationship with a primary care physician if you do not have one) for your post hospital discharge needs so that they can reassess your need for medications and monitor your lab values.  Total Time spent coordinating discharge including counseling, education and face to face time equals 45 minutes.  SignedJeoffrey Massed: GHIMIRE,SHANKER 06/03/2016 12:08 PM

## 2016-06-04 DIAGNOSIS — R404 Transient alteration of awareness: Secondary | ICD-10-CM | POA: Diagnosis not present

## 2016-06-05 NOTE — Consult Note (Signed)
           [facility] Primary [facility]igator  06/05/2016  [name] 02/22/1951 [id number]   Unable to see patient at the bedside to identify possible discharge needs since she wasdischarged home on 06/03/16.  Primary care provider's office called (Diana)to notify of patient's discharge and need for post hospital follow-up and transition of care.   Lafonda MossesDiana reported that primary care provider received a phone call at 2:00 this morning that patient expired.  For questions, please contact:  Wyatt HasteLorraine Kamoria [name], BSN, RN- [facility] Primary Care Navigator  Telephone: [phone] [facility]

## 2016-06-18 ENCOUNTER — Encounter: Payer: Medicare Other | Admitting: Cardiovascular Disease

## 2016-06-22 DIAGNOSIS — 419620001 Death: Secondary | SNOMED CT | POA: Diagnosis not present

## 2016-06-22 DEATH — deceased

## 2017-10-17 IMAGING — CT CT ANGIO CHEST-ABD-PELV FOR DISSECTION W/ AND WO/W CM
2 of 7 series · 14 of 46 positions shown, 16 images · IV contrast (isovue)
Comparison: Radiographs 06/01/2016

CLINICAL DATA: Constant chest pain, onset at [DATE]. Radiating into
the left arm and penetrating through to the back. Onset at rest.
Dyspnea.

EXAM:
CT ANGIOGRAPHY CHEST, ABDOMEN AND PELVIS
TECHNIQUE: Multidetector CT imaging through the chest, abdomen and pelvis was
performed using the standard protocol during bolus administration of
intravenous contrast. Multiplanar reconstructed images and MIPs were
obtained and reviewed to evaluate the vascular anatomy.
CONTRAST:  100 mL Isovue 370 intravenous

[Series 6: dissection axial arterial · axial · arterial · 0.83mm/px · z∈[-802,-247]mm · 11 of 207 slices shown, 13 images]
[im 11/207  soft-tissue]
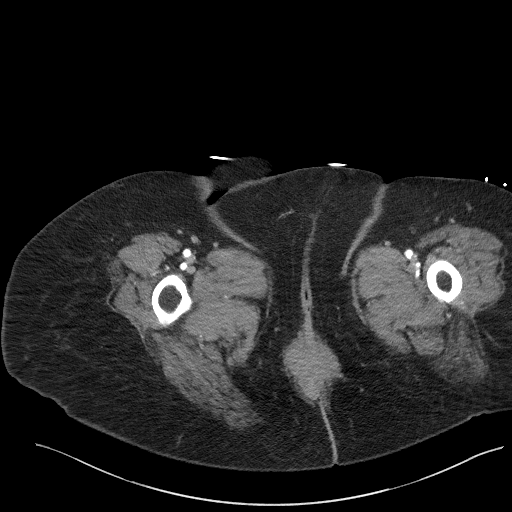
[im 11/207  bone]
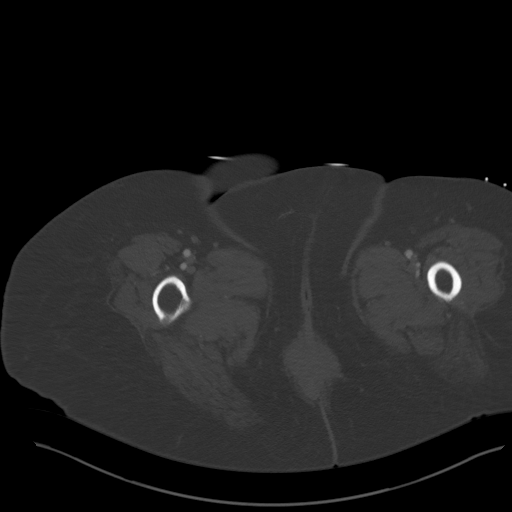
[im 33/207  soft-tissue]
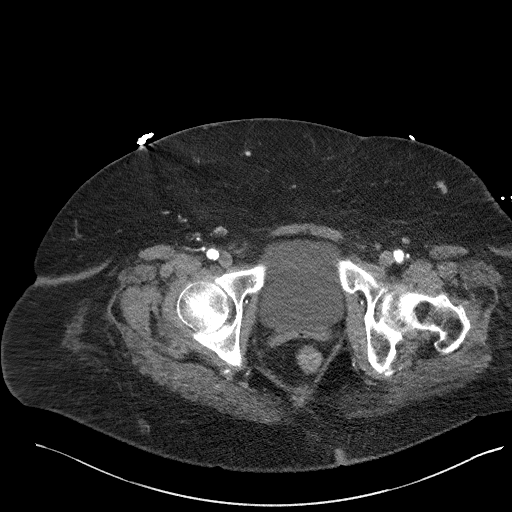
[im 55/207  soft-tissue]
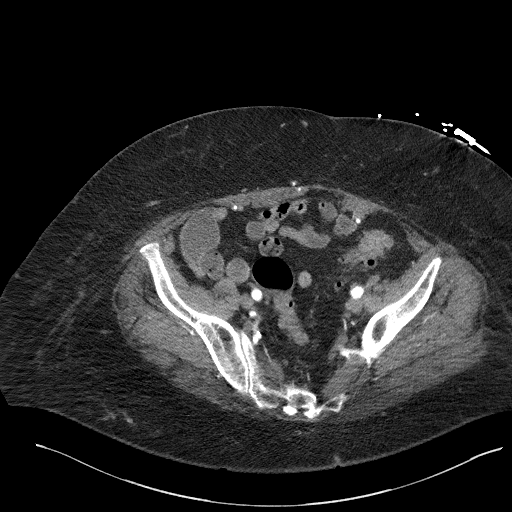
[im 66/207  soft-tissue]
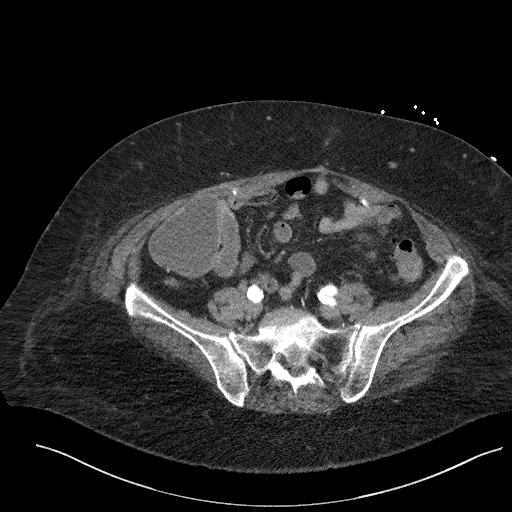
[im 87/207  soft-tissue]
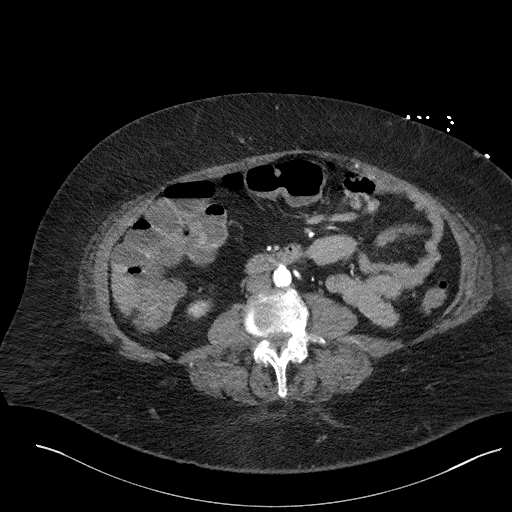
[im 109/207  soft-tissue]
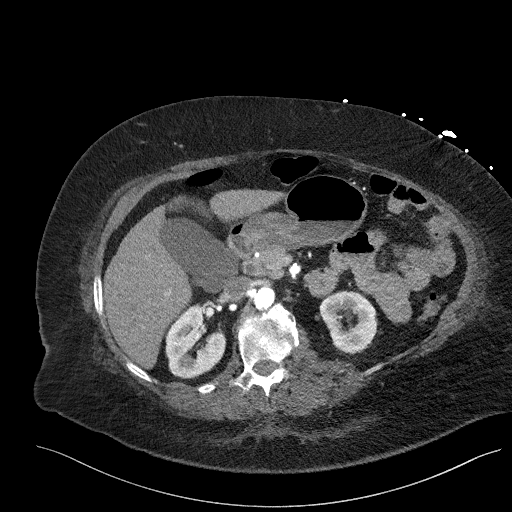
[im 120/207  soft-tissue]
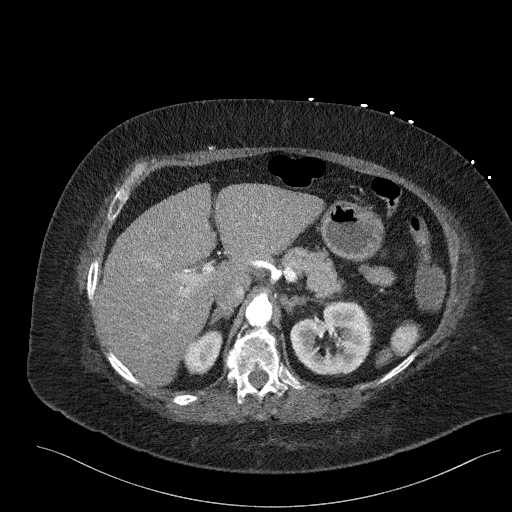
[im 141/207  soft-tissue]
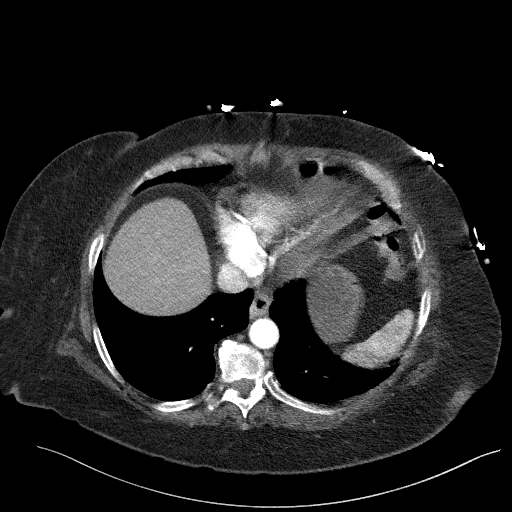
[im 152/207  soft-tissue]
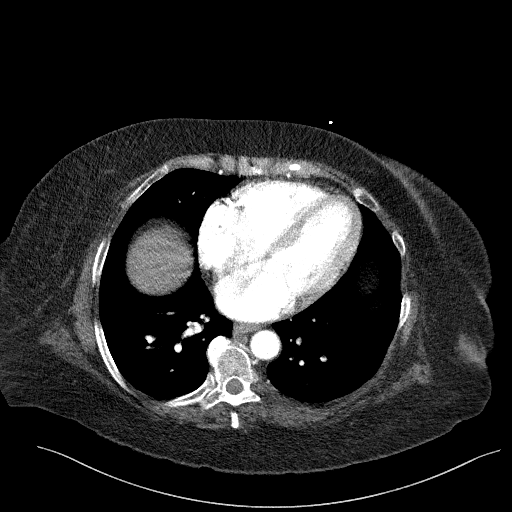
[im 152/207  bone]
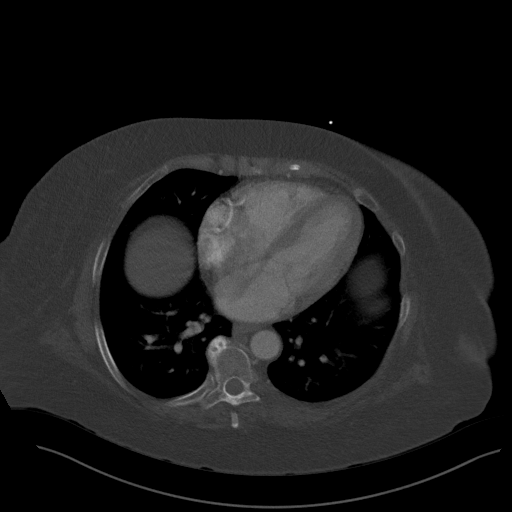
[im 174/207  soft-tissue]
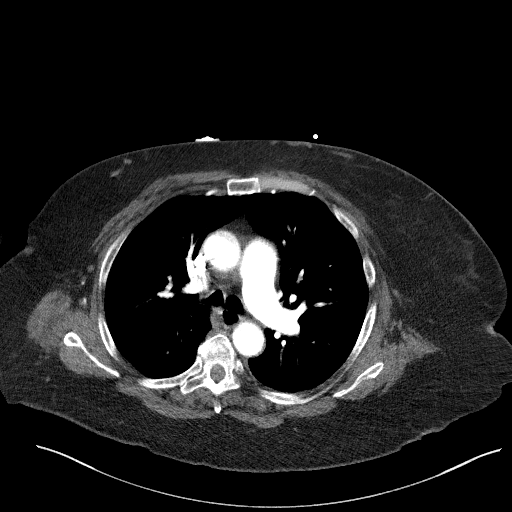
[im 196/207  soft-tissue]
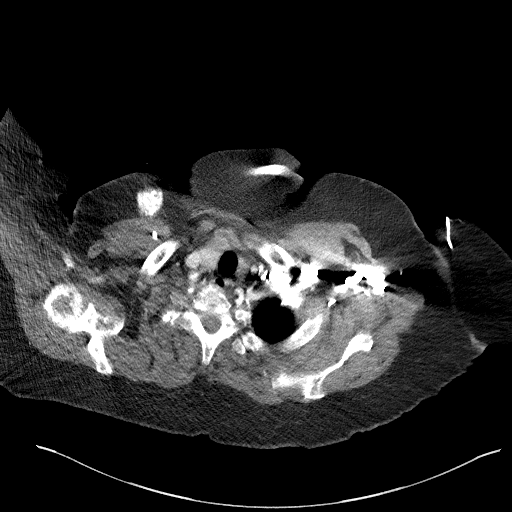

[Series 10: coronals · coronal · 0.90mm/px · 3 of 172 slices shown]
[im 43/172  soft-tissue]
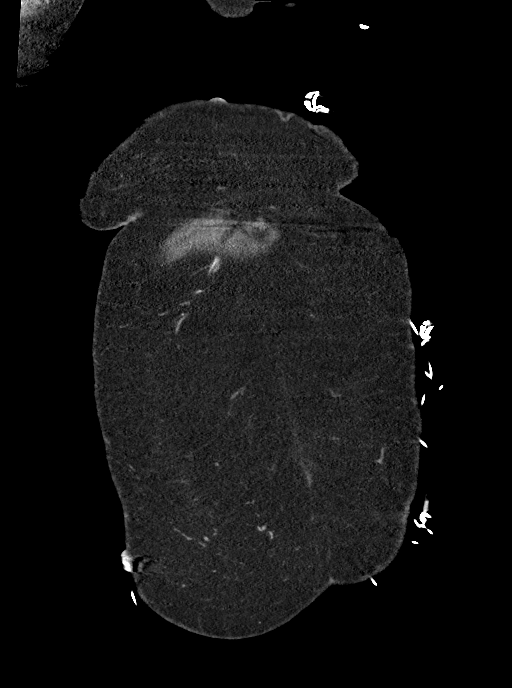
[im 86/172  soft-tissue]
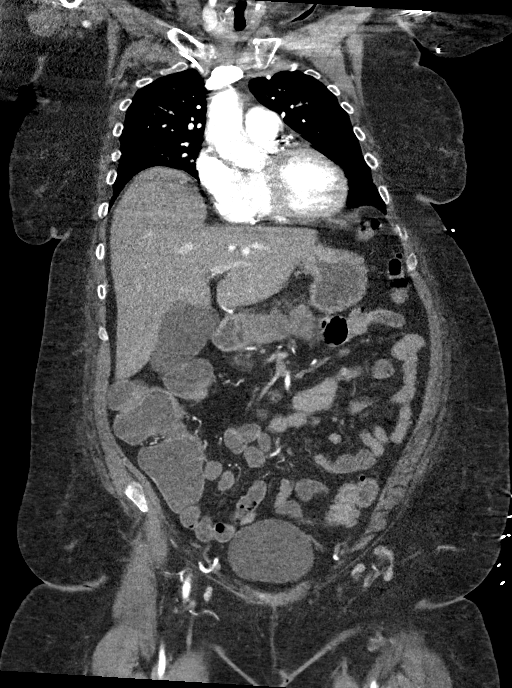
[im 129/172  soft-tissue]
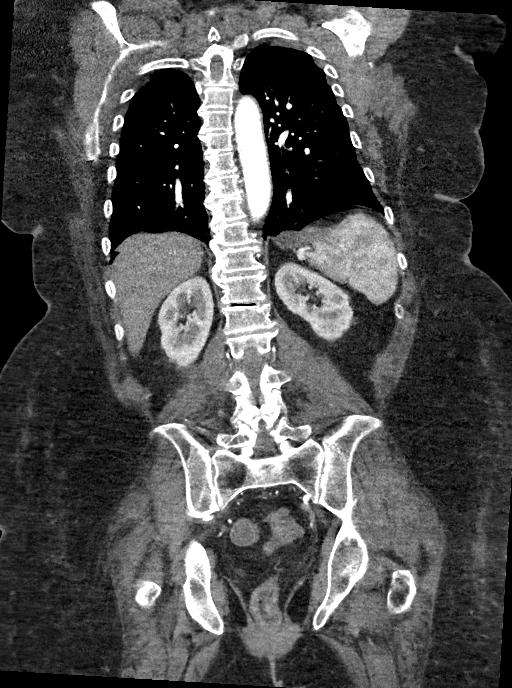

[14 of 46 positions shown; findings below may reference images not displayed]

FINDINGS: CTA CHEST FINDINGS

Cardiovascular: Preferential opacification of the thoracic aorta. No
evidence of thoracic aortic aneurysm or dissection. Normal heart
size. No pericardial effusion. Good opacification of the pulmonary
arteries. No embolism. Moderate atherosclerotic calcification of the
coronary arteries and great vessel origins.

Mediastinum/Nodes: No enlarged mediastinal, hilar, or axillary lymph
nodes. Thyroid gland, trachea, and esophagus demonstrate no
significant findings.

Lungs/Pleura: Lungs are clear. No pleural effusion or pneumothorax.

Musculoskeletal: No chest wall abnormality. No acute or significant
osseous findings.

Review of the MIP images confirms the above findings.

CTA ABDOMEN AND PELVIS FINDINGS

VASCULAR

Aorta: Normal caliber aorta without aneurysm, dissection, vasculitis
or significant stenosis.

Celiac: Patent without evidence of aneurysm, dissection, vasculitis
or significant stenosis.

SMA: Patent without evidence of aneurysm, dissection, vasculitis or
significant stenosis.

Renals: Left renal artery is patent. Probable stenosis of the right
renal artery in its proximal 1st cm.

IMA: Patent without evidence of aneurysm, dissection, vasculitis or
significant stenosis.

Inflow: Distal aorta and common iliac arteries are heavily calcified
but patent. External iliac and common femoral arteries are patent.
Femoral bifurcations are patent.

Veins: No obvious venous abnormality within the limitations of this
arterial phase study.

Review of the MIP images confirms the above findings.

NON-VASCULAR

Hepatobiliary: No focal liver abnormality is seen. No gallstones,
gallbladder wall thickening, or biliary dilatation.

Pancreas: Unremarkable. No pancreatic ductal dilatation or
surrounding inflammatory changes.

Spleen: Normal in size without focal abnormality.

Adrenals/Urinary Tract: Adrenal glands are unremarkable. Kidneys are
normal, without renal calculi, focal lesion, or hydronephrosis.
Bladder is unremarkable.

Stomach/Bowel: Stomach is within normal limits. Appendix is normal.
Moderate colonic diverticulosis. No evidence of bowel wall
thickening, distention, or inflammatory changes.

Lymphatic: No significant vascular findings are present. No enlarged
abdominal or pelvic lymph nodes.

Reproductive: Status post hysterectomy. No adnexal masses.

Other: No focal inflammation.  No ascites.

Musculoskeletal: No significant skeletal lead

Review of the MIP images confirms the above findings.
IMPRESSION: 1. No acute vascular abnormality.
2. Moderate atherosclerotic calcification of the aorta and coronary
arteries. Probable stenosis of the proximal right renal artery.
3. Colonic diverticulosis.

## 2017-10-17 IMAGING — CR DG CHEST 1V PORT
1 series · 1 of 1 positions shown · non-contrast
Comparison: 09/05/2011

CLINICAL DATA: Nonproductive cough for 3 days. Left-sided chest
pain tonight, radiating into the left arm. Dyspnea.

EXAM:
PORTABLE CHEST 1 VIEW

[portable]
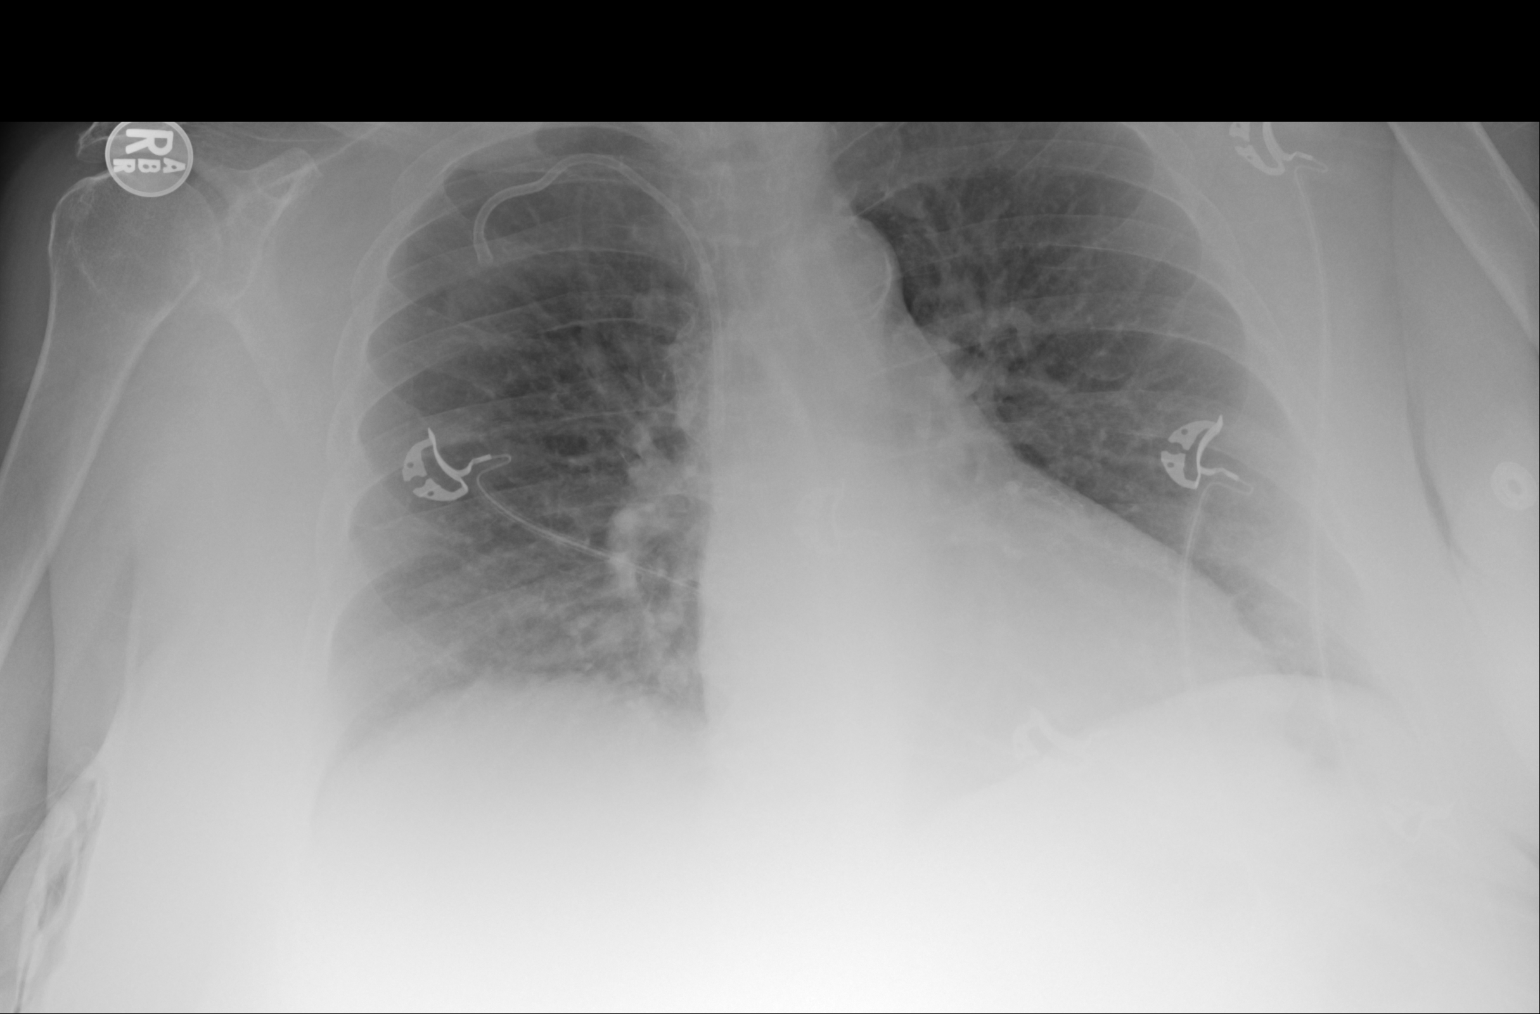

[1 of 1 positions shown; findings below may reference images not displayed]

FINDINGS: Right-sided central line extends into the low SVC.

The lungs are clear. Pulmonary vasculature is normal. No large
effusions. Normal heart size.
IMPRESSION: No acute cardiopulmonary findings.
